# Patient Record
Sex: Female | Born: 1945 | Race: White | Hispanic: No | Smoking: Never smoker
Health system: Southern US, Community
[De-identification: ages and names within clinical notes are randomized; demographics above are authoritative.]

## PROBLEM LIST (undated history)

## (undated) DIAGNOSIS — K219 Gastro-esophageal reflux disease without esophagitis: Secondary | ICD-10-CM

## (undated) DIAGNOSIS — M1990 Unspecified osteoarthritis, unspecified site: Secondary | ICD-10-CM

## (undated) DIAGNOSIS — I10 Essential (primary) hypertension: Secondary | ICD-10-CM

## (undated) HISTORY — DX: Essential (primary) hypertension: I10

## (undated) HISTORY — PX: KNEE SURGERY: SHX244

## (undated) HISTORY — DX: Gastro-esophageal reflux disease without esophagitis: K21.9

## (undated) HISTORY — DX: Unspecified osteoarthritis, unspecified site: M19.90

---

## 2009-06-23 DIAGNOSIS — M1990 Unspecified osteoarthritis, unspecified site: Secondary | ICD-10-CM | POA: Insufficient documentation

## 2009-06-23 DIAGNOSIS — M25569 Pain in unspecified knee: Secondary | ICD-10-CM | POA: Insufficient documentation

## 2009-06-23 DIAGNOSIS — K2100 Gastro-esophageal reflux disease with esophagitis, without bleeding: Secondary | ICD-10-CM | POA: Insufficient documentation

## 2009-06-23 HISTORY — DX: Gastro-esophageal reflux disease with esophagitis, without bleeding: K21.00

## 2009-10-11 ENCOUNTER — Encounter: Payer: Self-pay | Admitting: Cardiovascular Disease

## 2009-10-17 ENCOUNTER — Inpatient Hospital Stay (HOSPITAL_COMMUNITY): Admission: RE | Admit: 2009-10-17 | Discharge: 2009-10-19 | Payer: Self-pay | Admitting: Orthopedic Surgery

## 2010-12-13 DIAGNOSIS — K219 Gastro-esophageal reflux disease without esophagitis: Secondary | ICD-10-CM | POA: Insufficient documentation

## 2011-01-09 ENCOUNTER — Encounter: Payer: Self-pay | Admitting: Cardiovascular Disease

## 2011-01-16 ENCOUNTER — Encounter (HOSPITAL_COMMUNITY)
Admission: RE | Admit: 2011-01-16 | Discharge: 2011-01-16 | Disposition: A | Discharge status: Home / Self Care | Payer: BC Managed Care – PPO | Source: Ambulatory Visit

## 2011-01-16 ENCOUNTER — Other Ambulatory Visit (HOSPITAL_COMMUNITY): Payer: Self-pay | Admitting: Orthopedic Surgery

## 2011-01-16 ENCOUNTER — Ambulatory Visit (HOSPITAL_COMMUNITY)
Admission: RE | Admit: 2011-01-16 | Discharge: 2011-01-16 | Disposition: A | Discharge status: Home / Self Care | Payer: BC Managed Care – PPO | Source: Ambulatory Visit | Attending: Orthopedic Surgery | Admitting: Orthopedic Surgery

## 2011-01-16 DIAGNOSIS — M1711 Unilateral primary osteoarthritis, right knee: Secondary | ICD-10-CM

## 2011-01-16 DIAGNOSIS — Z01810 Encounter for preprocedural cardiovascular examination: Secondary | ICD-10-CM | POA: Insufficient documentation

## 2011-01-16 DIAGNOSIS — M1710 Unilateral primary osteoarthritis, unspecified knee: Secondary | ICD-10-CM | POA: Insufficient documentation

## 2011-01-16 DIAGNOSIS — IMO0002 Reserved for concepts with insufficient information to code with codable children: Secondary | ICD-10-CM | POA: Insufficient documentation

## 2011-01-16 DIAGNOSIS — Z01812 Encounter for preprocedural laboratory examination: Secondary | ICD-10-CM | POA: Insufficient documentation

## 2011-01-16 DIAGNOSIS — Z01818 Encounter for other preprocedural examination: Secondary | ICD-10-CM | POA: Insufficient documentation

## 2011-01-16 LAB — COMPREHENSIVE METABOLIC PANEL
Albumin: 4.2 g/dL (ref 3.5–5.2)
Alkaline Phosphatase: 104 U/L (ref 39–117)
BUN: 19 mg/dL (ref 6–23)
Calcium: 9.8 mg/dL (ref 8.4–10.5)
Creatinine, Ser: 0.89 mg/dL (ref 0.4–1.2)
Glucose, Bld: 109 mg/dL — ABNORMAL HIGH (ref 70–99)
Potassium: 4.2 mEq/L (ref 3.5–5.1)
Total Protein: 6.8 g/dL (ref 6.0–8.3)

## 2011-01-16 LAB — URINALYSIS, ROUTINE W REFLEX MICROSCOPIC
Bilirubin Urine: NEGATIVE
Nitrite: POSITIVE — AB
Specific Gravity, Urine: 1.023 (ref 1.005–1.030)
pH: 6.5 (ref 5.0–8.0)

## 2011-01-16 LAB — CBC
HCT: 42.9 % (ref 36.0–46.0)
Hemoglobin: 14.5 g/dL (ref 12.0–15.0)
MCHC: 33.8 g/dL (ref 30.0–36.0)
MCV: 89.2 fL (ref 78.0–100.0)
RDW: 12.9 % (ref 11.5–15.5)
WBC: 7.5 10*3/uL (ref 4.0–10.5)

## 2011-01-16 LAB — DIFFERENTIAL
Eosinophils Relative: 1 % (ref 0–5)
Monocytes Relative: 9 % (ref 3–12)
Neutrophils Relative %: 50 % (ref 43–77)

## 2011-01-16 LAB — SURGICAL PCR SCREEN
MRSA, PCR: NEGATIVE
Staphylococcus aureus: POSITIVE — AB

## 2011-01-16 LAB — URINE MICROSCOPIC-ADD ON

## 2011-01-16 LAB — APTT: aPTT: 27 seconds (ref 24–37)

## 2011-01-18 LAB — URINE CULTURE
Colony Count: >=100000
Culture  Setup Time: 201202281159

## 2011-01-19 ENCOUNTER — Encounter: Payer: Self-pay | Admitting: Cardiovascular Disease

## 2011-01-19 ENCOUNTER — Encounter (INDEPENDENT_AMBULATORY_CARE_PROVIDER_SITE_OTHER): Payer: BC Managed Care – PPO | Admitting: Cardiovascular Disease

## 2011-01-19 ENCOUNTER — Other Ambulatory Visit: Payer: Self-pay | Admitting: Cardiovascular Disease

## 2011-01-19 DIAGNOSIS — R0989 Other specified symptoms and signs involving the circulatory and respiratory systems: Secondary | ICD-10-CM | POA: Insufficient documentation

## 2011-01-19 DIAGNOSIS — R000 Tachycardia, unspecified: Secondary | ICD-10-CM

## 2011-01-19 DIAGNOSIS — Z01810 Encounter for preprocedural cardiovascular examination: Secondary | ICD-10-CM

## 2011-01-24 ENCOUNTER — Inpatient Hospital Stay (HOSPITAL_COMMUNITY)
Admission: RE | Admit: 2011-01-24 | Discharge: 2011-01-26 | DRG: 470 | Disposition: A | Discharge status: Home Health | Source: Ambulatory Visit | Attending: Orthopedic Surgery | Admitting: Orthopedic Surgery

## 2011-01-24 DIAGNOSIS — M1710 Unilateral primary osteoarthritis, unspecified knee: Principal | ICD-10-CM | POA: Diagnosis present

## 2011-01-24 DIAGNOSIS — D62 Acute posthemorrhagic anemia: Secondary | ICD-10-CM | POA: Diagnosis not present

## 2011-01-24 DIAGNOSIS — Z79899 Other long term (current) drug therapy: Secondary | ICD-10-CM

## 2011-01-24 DIAGNOSIS — Z96659 Presence of unspecified artificial knee joint: Secondary | ICD-10-CM

## 2011-01-24 DIAGNOSIS — K219 Gastro-esophageal reflux disease without esophagitis: Secondary | ICD-10-CM | POA: Diagnosis present

## 2011-01-24 DIAGNOSIS — I10 Essential (primary) hypertension: Secondary | ICD-10-CM | POA: Diagnosis present

## 2011-01-24 DIAGNOSIS — I499 Cardiac arrhythmia, unspecified: Secondary | ICD-10-CM | POA: Diagnosis present

## 2011-01-24 LAB — TYPE AND SCREEN: Antibody Screen: NEGATIVE

## 2011-01-25 LAB — BASIC METABOLIC PANEL
CO2: 25 mEq/L (ref 19–32)
Calcium: 7.9 mg/dL — ABNORMAL LOW (ref 8.4–10.5)
GFR calc Af Amer: >60 mL/min (ref >60)
GFR calc non Af Amer: >60 mL/min (ref >60)
Glucose, Bld: 125 mg/dL — ABNORMAL HIGH (ref 70–99)
Potassium: 4.6 mEq/L (ref 3.5–5.1)
Sodium: 132 mEq/L — ABNORMAL LOW (ref 135–145)

## 2011-01-25 LAB — CBC
HCT: 31.3 % — ABNORMAL LOW (ref 36.0–46.0)
Hemoglobin: 10.2 g/dL — ABNORMAL LOW (ref 12.0–15.0)
MCHC: 32.6 g/dL (ref 30.0–36.0)
RBC: 3.49 MIL/uL — ABNORMAL LOW (ref 3.87–5.11)
WBC: 8.2 10*3/uL (ref 4.0–10.5)

## 2011-01-25 NOTE — Assessment & Plan Note (Signed)
Summary: np6/ EKG infarect/ pt have blue cross/[per Olegario Messier [phone]   Visit Type:  Initial Consult Referring Provider:  Dr. Nilda Simmer Primary Provider:  Dr. Wilburt Finlay  CC:  pt has no complaints today. surgery clearance for right knee.Marland Kitchen  History of Present Illness: 65 yo WF with history of borderline HTN, GERD who is here today for cardiac evaluation before planned right knee replacement. She has had a prior left knee replacement. 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 She was being evaluated for her surgery and her EKG was felt to be slightly different than in the past. She was tachycardic. She has had no chest pain, SOB, palpitations, near syncope, LE edema, orthopnea, PND.   Current Medications (verified): 1)  Ranitidine Hcl 150 Mg Caps (Ranitidine Hcl) .Marland Kitchen.. 1-2 Tablets Once Daily. 2)  Amoxicillin 875 Mg Tabs (Amoxicillin) .Marland Kitchen.. 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 Never smoked Alcohol Use - no Regular Exercise - no Drug Use - no  Review of Systems       The patient complains of joint pain.  The patient denies fatigue, malaise, fever, weight gain/loss, vision loss, decreased hearing, hoarseness, chest pain, palpitations, shortness of breath, prolonged cough, wheezing, sleep apnea, coughing up blood, abdominal pain, blood in stool, nausea, vomiting, diarrhea, heartburn, incontinence, blood in urine, muscle weakness, leg swelling, rash, skin lesions, headache, fainting, dizziness, depression, anxiety, enlarged lymph nodes, easy  bruising or bleeding, and environmental allergies.    Vital Signs:  Patient profile:   65 year old female Height:      62.25 inches Weight:      163.50 pounds BMI:     29.77 Pulse rate:   110 / minute Resp:     16 per minute BP sitting:   165 / 88  (left arm)  Vitals Entered By: Celestia Khat, CMA (January 19, 2011 10:38 AM)  Physical Exam  General:  General: Well developed, well nourished, NAD HEENT: OP clear, mucus membranes moist SKIN: warm, dry Neuro: No focal deficits Musculoskeletal: Muscle strength 5/5 all ext Psychiatric: Mood and affect normal Neck: No JVD, no carotid bruits, no thyromegaly, no lymphadenopathy. Lungs:Clear bilaterally, no wheezes, rhonci, crackles CV: RRR no murmurs, gallops rubs Abdomen: soft, NT, ND, BS present Extremities: No edema, pulses 2+.    Impression & Recommendations:  Problem # 1:  TACHYCARDIA (ICD-785) Sinus tachycardia. She is very anxious. I do not see any changes on her EKG over the last year other than the tachycardia. She has no chest pain, dyspnea, near syncope or syncope. She is very anxious which probably does contribute to her tachycardia. She also has a UTI. Will check TSH today.   Problem # 2:  PRE-OPERATIVE CARDIAC EXAM (ICD-V72.81) Based on current ACC/AHA guidelines, no ischemic testing is needed before surgery. She can proceed to her knee replacement  without further cardiac workup.   Orders: EKG w/ Interpretation (93000) TLB-TSH (Thyroid Stimulating Hormone) (16109-UEA)  Patient Instructions: 1)  Your physician recommends that you schedule a follow-up appointment as  needed. 2)  Your physician recommends that you continue on your current medications as directed. Please refer to the Current Medication list given to you today.

## 2011-01-26 LAB — CBC
HCT: 30.1 % — ABNORMAL LOW (ref 36.0–46.0)
Hemoglobin: 9.7 g/dL — ABNORMAL LOW (ref 12.0–15.0)
MCH: 28.9 pg (ref 26.0–34.0)
MCHC: 32.2 g/dL (ref 30.0–36.0)
RBC: 3.36 MIL/uL — ABNORMAL LOW (ref 3.87–5.11)

## 2011-01-26 LAB — BASIC METABOLIC PANEL
CO2: 25 mEq/L (ref 19–32)
Chloride: 104 mEq/L (ref 96–112)
GFR calc Af Amer: >60 mL/min (ref >60)
Glucose, Bld: 120 mg/dL — ABNORMAL HIGH (ref 70–99)
Sodium: 134 mEq/L — ABNORMAL LOW (ref 135–145)

## 2011-02-12 NOTE — Op Note (Signed)
Kayla Clark, Kayla Clark              ACCOUNT NO.:  [id number]  MEDICAL RECORD NO.:  [id number]           PATIENT TYPE:  I  LOCATION:  5005                         FACILITY:  [facility]  PHYSICIAN:  Taysean Wager A. Thurston Hole, M.D. DATE OF BIRTH:  Mar 02, 1946  DATE OF PROCEDURE:  01/24/2011 DATE OF DISCHARGE:                              OPERATIVE REPORT   PREOPERATIVE DIAGNOSIS:  Right knee degenerative joint disease.  POSTOPERATIVE DIAGNOSIS:  Right knee degenerative joint disease.  PROCEDURES: 1. Right total knee replacement using DePuy cemented total knee system     with #2.5 cemented femur, #3 cemented tibia with 12.5 mm     polyethylene RP tibial spacer, and 35-mm polyethylene cemented     patella. 2. Zinacef impregnated cement.  SURGEON:  Elana Alm. Thurston Hole, MD  ASSISTANT:  Julien Girt, PA-C  ANESTHESIA:  General.  OPERATIVE TIME:  1 hour and 30 minutes.  COMPLICATIONS:  None.  DESCRIPTION OF PROCEDURE:  Kayla Clark was brought to the operating room on January 24, 2011 after a femoral nerve block was placed in holding by Anesthesia.  She was placed on operative table in supine position. After being placed under general anesthesia, she had a Foley catheter placed under sterile conditions.  Her right knee was examined.  Range of motion -5 to 125 degrees, moderate varus deformity, knee stable ligamentous exam with normal patellar tracking.  She received Ancef 2 grams IV preoperatively for prophylaxis.  Her right leg was prepped using sterile DuraPrep and draped using sterile technique.  Time-out procedure was called and correct right knee identified.  The right leg was exsanguinated and the thigh tourniquet elevated to 365 mmHg. Initially through a 15-cm longitudinal incision based over the patella, initial exposure was made.  The underlying subcutaneous tissues were incised along the skin incision.  A median arthrotomy was performed revealing an excessive amount of  normal-appearing joint fluid.  The articular surfaces were inspected.  She had grade 4 changes medially, grade 3-4 changes laterally, and grade 3-4 changes in the patellofemoral joint.  Osteophytes were removed from the femoral condyles and tibial plateau.  The medial and lateral meniscal remnants were removed as well as the anterior cruciate ligament.  An intramedullary drill was then drilled up the femoral canal for placement of the distal femoral cutting jig which was placed in the appropriate manner rotation and the distal 10-mm cut was made.  The distal femur was then sized.  The #2.5 was found to be the appropriate size and the #2.5 cutting jig was placed in the appropriate manner of external rotation and then these cuts were made.  The proximal tibia was then exposed.  Tibial spines were removed with an oscillating saw.  Intramedullary drill was drilled down the tibial canal for placement of the proximal tibial cutting jig which was placed in the appropriate manner of rotation and the proximal 6-mm cut was made based off the medial or lower side.  Spacer blocks were then placed in flexion and extension.  The 12.5 mm blocks gave excellent balancing, excellent stability, and excellent correction of her flexion and varus deformities.  At this point, the #  3 tibial baseplate trial was placed on the cut tibial surface with an excellent fit and the keel cut was made.  The PCL box cutter was then placed on the distal femur and these cuts were made.  At this point, with the #2.5 femoral trial in place and the #3 tibial baseplate trial in place, a 12.5 mm polyethylene RP tibial spacer was placed on the tibial baseplate.  The knee reduced, taken through a range of motion from 0-125 degrees and had excellent correction of flexion and varus deformities.  Normal patellar tracking noted as well.  A resurfacing 8.5 mm cut was made on patella and 3 locking holes were placed for a 35-mm polyethylene  patellar trial. Again, patellofemoral tracking was evaluated and found to be normal.  At this point, it was felt that all the trial components were of excellent size and stability.  They were then removed.  The knee was then jet lavage irrigated with 3 L of saline.  The proximal tibia was then exposed and #3 tibial baseplate was then assessed with impregnated cement packing was hammered into position with an excellent fit with excess cement being removed around the edges.  The #2.5 femoral component with cement backing was hammered into position also with an excellent fit with excess cement being removed around the edges.  The 12.5 mm polyethylene RP tibial spacer was placed on tibial baseplate. The knee reduced, taken through range of motion, 0-125 degrees with excellent stability and excellent correction of the flexion and varus deformities.  The 35-mm polyethylene cement backed patella was then placed into its position and held there with a clamp.  After the cement hardened, again patellofemoral tracking was evaluated and found to be normal.  At this point, it was felt that all components were of excellent size and stability.  The wound was further irrigated with saline and then the arthrotomy was closed with #1 Ethilon suture over 2 medium Hemovac drains.  Subcutaneous tissues were closed with 0 and 2-0 Vicryl and subcuticular layer closed with 4-0 Monocryl.  Sterile dressings and long-leg splint applied.  The patient awakened, extubated, and taken to recovery room in stable condition.  Needle and sponge counts were correct x2 at the end of the case.  Neurovascular status was normal.  Pulses 2+ and symmetric.     Menno Vanbergen A. Thurston Hole, M.D.     RAW/MEDQ  D:  01/24/2011  T:  01/25/2011  Job:  [id number]  Electronically Signed by Salvatore Marvel M.D. on 02/12/2011 01:40:17 PM

## 2011-02-13 ENCOUNTER — Ambulatory Visit: Attending: Orthopedic Surgery | Admitting: Physical Therapy

## 2011-02-13 DIAGNOSIS — M1710 Unilateral primary osteoarthritis, unspecified knee: Secondary | ICD-10-CM | POA: Insufficient documentation

## 2011-02-13 DIAGNOSIS — M25569 Pain in unspecified knee: Secondary | ICD-10-CM | POA: Insufficient documentation

## 2011-02-13 DIAGNOSIS — Z96659 Presence of unspecified artificial knee joint: Secondary | ICD-10-CM | POA: Insufficient documentation

## 2011-02-13 DIAGNOSIS — IMO0001 Reserved for inherently not codable concepts without codable children: Secondary | ICD-10-CM | POA: Insufficient documentation

## 2011-02-13 DIAGNOSIS — M6281 Muscle weakness (generalized): Secondary | ICD-10-CM | POA: Insufficient documentation

## 2011-02-13 DIAGNOSIS — R269 Unspecified abnormalities of gait and mobility: Secondary | ICD-10-CM | POA: Insufficient documentation

## 2011-02-13 DIAGNOSIS — M25669 Stiffness of unspecified knee, not elsewhere classified: Secondary | ICD-10-CM | POA: Insufficient documentation

## 2011-02-15 NOTE — Letter (Signed)
Summary: [facility]  [facility]   Imported By: Marylou Mccoy 02/05/2011 11:32:27  _____________________________________________________________________  External Attachment:    Type:   Image     Comment:   External Document

## 2011-02-16 ENCOUNTER — Ambulatory Visit: Attending: Orthopedic Surgery | Admitting: Physical Therapy

## 2011-02-16 DIAGNOSIS — M1710 Unilateral primary osteoarthritis, unspecified knee: Secondary | ICD-10-CM | POA: Insufficient documentation

## 2011-02-16 DIAGNOSIS — M25669 Stiffness of unspecified knee, not elsewhere classified: Secondary | ICD-10-CM | POA: Insufficient documentation

## 2011-02-16 DIAGNOSIS — R269 Unspecified abnormalities of gait and mobility: Secondary | ICD-10-CM | POA: Insufficient documentation

## 2011-02-16 DIAGNOSIS — M25569 Pain in unspecified knee: Secondary | ICD-10-CM | POA: Insufficient documentation

## 2011-02-16 DIAGNOSIS — IMO0001 Reserved for inherently not codable concepts without codable children: Secondary | ICD-10-CM | POA: Insufficient documentation

## 2011-02-16 DIAGNOSIS — M6281 Muscle weakness (generalized): Secondary | ICD-10-CM | POA: Insufficient documentation

## 2011-02-19 ENCOUNTER — Ambulatory Visit: Attending: Orthopedic Surgery | Admitting: Physical Therapy

## 2011-02-19 DIAGNOSIS — M25569 Pain in unspecified knee: Secondary | ICD-10-CM | POA: Insufficient documentation

## 2011-02-19 DIAGNOSIS — M6281 Muscle weakness (generalized): Secondary | ICD-10-CM | POA: Insufficient documentation

## 2011-02-19 DIAGNOSIS — Z96659 Presence of unspecified artificial knee joint: Secondary | ICD-10-CM | POA: Insufficient documentation

## 2011-02-19 DIAGNOSIS — M1710 Unilateral primary osteoarthritis, unspecified knee: Secondary | ICD-10-CM | POA: Insufficient documentation

## 2011-02-19 DIAGNOSIS — IMO0001 Reserved for inherently not codable concepts without codable children: Secondary | ICD-10-CM | POA: Insufficient documentation

## 2011-02-19 DIAGNOSIS — M25669 Stiffness of unspecified knee, not elsewhere classified: Secondary | ICD-10-CM | POA: Insufficient documentation

## 2011-02-19 DIAGNOSIS — R269 Unspecified abnormalities of gait and mobility: Secondary | ICD-10-CM | POA: Insufficient documentation

## 2011-02-20 LAB — CBC
HCT: 27.7 % — ABNORMAL LOW (ref 36.0–46.0)
Hemoglobin: 9.6 g/dL — ABNORMAL LOW (ref 12.0–15.0)
MCHC: 34.6 g/dL (ref 30.0–36.0)
MCV: 91.1 fL (ref 78.0–100.0)
Platelets: 139 10*3/uL — ABNORMAL LOW (ref 150–400)
RDW: 12.6 % (ref 11.5–15.5)

## 2011-02-20 LAB — BASIC METABOLIC PANEL
BUN: 9 mg/dL (ref 6–23)
CO2: 26 mEq/L (ref 19–32)
Glucose, Bld: 120 mg/dL — ABNORMAL HIGH (ref 70–99)
Potassium: 3.9 mEq/L (ref 3.5–5.1)
Sodium: 135 mEq/L (ref 135–145)

## 2011-02-21 ENCOUNTER — Ambulatory Visit: Admitting: Physical Therapy

## 2011-02-21 LAB — COMPREHENSIVE METABOLIC PANEL
Albumin: 4.6 g/dL (ref 3.5–5.2)
BUN: 33 mg/dL — ABNORMAL HIGH (ref 6–23)
Calcium: 9.9 mg/dL (ref 8.4–10.5)
Chloride: 107 mEq/L (ref 96–112)
Creatinine, Ser: 0.99 mg/dL (ref 0.4–1.2)
Total Bilirubin: 0.6 mg/dL (ref 0.3–1.2)
Total Protein: 8 g/dL (ref 6.0–8.3)

## 2011-02-21 LAB — BASIC METABOLIC PANEL
BUN: 12 mg/dL (ref 6–23)
CO2: 26 mEq/L (ref 19–32)
Calcium: 8.3 mg/dL — ABNORMAL LOW (ref 8.4–10.5)
GFR calc non Af Amer: >60 mL/min (ref >60)
Glucose, Bld: 125 mg/dL — ABNORMAL HIGH (ref 70–99)

## 2011-02-21 LAB — BLOOD GAS, ARTERIAL
Acid-base deficit: 2.1 mmol/L — ABNORMAL HIGH (ref 0.0–2.0)
Drawn by: 181601
O2 Saturation: 98.6 %
TCO2: 22.7 mmol/L (ref 0–100)

## 2011-02-21 LAB — URINALYSIS, ROUTINE W REFLEX MICROSCOPIC
Bilirubin Urine: NEGATIVE
Hgb urine dipstick: NEGATIVE
Ketones, ur: NEGATIVE mg/dL
Protein, ur: NEGATIVE mg/dL
Urobilinogen, UA: 0.2 mg/dL (ref 0.0–1.0)

## 2011-02-21 LAB — CBC
HCT: 28.8 % — ABNORMAL LOW (ref 36.0–46.0)
HCT: 42.8 % (ref 36.0–46.0)
MCHC: 35.1 g/dL (ref 30.0–36.0)
MCHC: 35.1 g/dL (ref 30.0–36.0)
MCV: 89.8 fL (ref 78.0–100.0)
Platelets: 149 10*3/uL — ABNORMAL LOW (ref 150–400)
Platelets: 248 10*3/uL (ref 150–400)
RDW: 13 % (ref 11.5–15.5)
RDW: 13.1 % (ref 11.5–15.5)

## 2011-02-21 LAB — DIFFERENTIAL
Basophils Absolute: 0.1 10*3/uL (ref 0.0–0.1)
Lymphocytes Relative: 33 % (ref 12–46)
Lymphs Abs: 2.4 10*3/uL (ref 0.7–4.0)
Monocytes Absolute: 0.6 10*3/uL (ref 0.1–1.0)
Neutro Abs: 4 10*3/uL (ref 1.7–7.7)

## 2011-02-21 LAB — TYPE AND SCREEN
ABO/RH(D): O POS
Antibody Screen: NEGATIVE

## 2011-02-21 LAB — URINE CULTURE

## 2011-02-21 LAB — ABO/RH: ABO/RH(D): O POS

## 2011-02-26 ENCOUNTER — Ambulatory Visit: Admitting: Physical Therapy

## 2011-02-28 ENCOUNTER — Ambulatory Visit: Admitting: Physical Therapy

## 2011-02-28 DIAGNOSIS — M1710 Unilateral primary osteoarthritis, unspecified knee: Secondary | ICD-10-CM | POA: Insufficient documentation

## 2011-02-28 DIAGNOSIS — M25669 Stiffness of unspecified knee, not elsewhere classified: Secondary | ICD-10-CM | POA: Insufficient documentation

## 2011-02-28 DIAGNOSIS — M25569 Pain in unspecified knee: Secondary | ICD-10-CM | POA: Insufficient documentation

## 2011-02-28 DIAGNOSIS — M6281 Muscle weakness (generalized): Secondary | ICD-10-CM | POA: Insufficient documentation

## 2011-02-28 DIAGNOSIS — IMO0001 Reserved for inherently not codable concepts without codable children: Secondary | ICD-10-CM | POA: Insufficient documentation

## 2011-02-28 DIAGNOSIS — R269 Unspecified abnormalities of gait and mobility: Secondary | ICD-10-CM | POA: Insufficient documentation

## 2011-03-02 ENCOUNTER — Ambulatory Visit: Admitting: Physical Therapy

## 2011-03-05 ENCOUNTER — Ambulatory Visit: Admitting: Physical Therapy

## 2011-03-07 ENCOUNTER — Ambulatory Visit: Admitting: Physical Therapy

## 2011-03-09 ENCOUNTER — Ambulatory Visit: Admitting: Physical Therapy

## 2011-03-12 ENCOUNTER — Ambulatory Visit: Admitting: Physical Therapy

## 2011-03-14 ENCOUNTER — Ambulatory Visit: Admitting: Physical Therapy

## 2011-03-16 ENCOUNTER — Ambulatory Visit: Admitting: Physical Therapy

## 2011-03-19 ENCOUNTER — Ambulatory Visit: Admitting: Physical Therapy

## 2011-03-21 ENCOUNTER — Ambulatory Visit: Attending: Orthopedic Surgery | Admitting: Physical Therapy

## 2011-03-21 DIAGNOSIS — R269 Unspecified abnormalities of gait and mobility: Secondary | ICD-10-CM | POA: Insufficient documentation

## 2011-03-21 DIAGNOSIS — M6281 Muscle weakness (generalized): Secondary | ICD-10-CM | POA: Insufficient documentation

## 2011-03-21 DIAGNOSIS — M25669 Stiffness of unspecified knee, not elsewhere classified: Secondary | ICD-10-CM | POA: Insufficient documentation

## 2011-03-21 DIAGNOSIS — Z96659 Presence of unspecified artificial knee joint: Secondary | ICD-10-CM | POA: Insufficient documentation

## 2011-03-21 DIAGNOSIS — M1710 Unilateral primary osteoarthritis, unspecified knee: Secondary | ICD-10-CM | POA: Insufficient documentation

## 2011-03-21 DIAGNOSIS — IMO0001 Reserved for inherently not codable concepts without codable children: Secondary | ICD-10-CM | POA: Insufficient documentation

## 2011-03-21 DIAGNOSIS — M25569 Pain in unspecified knee: Secondary | ICD-10-CM | POA: Insufficient documentation

## 2011-03-23 ENCOUNTER — Ambulatory Visit: Admitting: Physical Therapy

## 2011-03-26 ENCOUNTER — Ambulatory Visit: Admitting: Physical Therapy

## 2011-03-28 ENCOUNTER — Ambulatory Visit: Admitting: Physical Therapy

## 2011-03-30 ENCOUNTER — Ambulatory Visit: Admitting: Physical Therapy

## 2011-04-02 ENCOUNTER — Ambulatory Visit: Admitting: Physical Therapy

## 2011-04-04 ENCOUNTER — Ambulatory Visit: Admitting: Physical Therapy

## 2011-04-06 ENCOUNTER — Ambulatory Visit: Admitting: Physical Therapy

## 2011-04-09 ENCOUNTER — Ambulatory Visit: Admitting: Physical Therapy

## 2011-04-11 ENCOUNTER — Ambulatory Visit: Admitting: Physical Therapy

## 2011-04-13 ENCOUNTER — Ambulatory Visit: Admitting: Physical Therapy

## 2011-04-17 ENCOUNTER — Ambulatory Visit: Admitting: Physical Therapy

## 2011-04-19 ENCOUNTER — Ambulatory Visit: Admitting: Physical Therapy

## 2011-07-01 NOTE — Op Note (Signed)
  [name], [name]              ACCOUNT NO.:  [id number]  MEDICAL RECORD NO.:  [id number]  LOCATION:                                 FACILITY:  PHYSICIAN:  Elana Alm. Thurston Hole, M.D. DATE OF BIRTH:  1946-06-02  DATE OF PROCEDURE:  01/24/2011 DATE OF DISCHARGE:                              OPERATIVE REPORT   ADDENDUM: This is an addendum to the coding of the primary osteoarthritis for Ms. Kayla Clark whose surgery was on January 24, 2011, for a right total knee replacement.  The correct diagnosis is primary localized osteoarthritis of the knee code #715.16 instead of localized osteoarthritis nonspecified code number 715.36.  The correct code is 715.16, which is primary localized osteoarthritis.     Zyen Triggs A. Thurston Hole, M.D.     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 on 07/01/2011 02:52:58 PM

## 2013-03-19 LAB — HM PAP SMEAR: HM Pap smear: NORMAL

## 2015-04-07 ENCOUNTER — Ambulatory Visit (HOSPITAL_BASED_OUTPATIENT_CLINIC_OR_DEPARTMENT_OTHER)
Admission: RE | Admit: 2015-04-07 | Discharge: 2015-04-07 | Disposition: A | Discharge status: Home / Self Care | Payer: Federal, State, Local not specified - PPO | Source: Ambulatory Visit | Attending: Medical | Admitting: Medical

## 2015-04-07 ENCOUNTER — Encounter: Payer: Self-pay | Admitting: Medical

## 2015-04-07 ENCOUNTER — Ambulatory Visit (INDEPENDENT_AMBULATORY_CARE_PROVIDER_SITE_OTHER): Payer: Federal, State, Local not specified - PPO | Admitting: Medical

## 2015-04-07 VITALS — BP 165/100 | HR 81 | Temp 98.3°F | Ht 62.5 in | Wt 161.6 lb

## 2015-04-07 DIAGNOSIS — I10 Essential (primary) hypertension: Secondary | ICD-10-CM | POA: Diagnosis not present

## 2015-04-07 DIAGNOSIS — M25551 Pain in right hip: Secondary | ICD-10-CM | POA: Insufficient documentation

## 2015-04-07 DIAGNOSIS — R000 Tachycardia, unspecified: Secondary | ICD-10-CM

## 2015-04-07 DIAGNOSIS — W19XXXA Unspecified fall, initial encounter: Secondary | ICD-10-CM | POA: Diagnosis not present

## 2015-04-07 DIAGNOSIS — M25559 Pain in unspecified hip: Secondary | ICD-10-CM | POA: Insufficient documentation

## 2015-04-07 DIAGNOSIS — S32301A Unspecified fracture of right ilium, initial encounter for closed fracture: Secondary | ICD-10-CM | POA: Insufficient documentation

## 2015-04-07 DIAGNOSIS — M8580 Other specified disorders of bone density and structure, unspecified site: Secondary | ICD-10-CM | POA: Insufficient documentation

## 2015-04-07 MED ORDER — LISINOPRIL 10 MG PO TABS: 10.0000 mg | ORAL_TABLET | Freq: Every day | ORAL | Status: DC

## 2015-04-07 MED ORDER — TRAMADOL HCL 50 MG PO TABS: 50.0000 mg | ORAL_TABLET | Freq: Three times a day (TID) | ORAL | Status: DC | PRN

## 2015-04-07 NOTE — Assessment & Plan Note (Addendum)
Will rx lisinopril today. Pt bp is elevated today with hx of elevation in past. Some pain, nsaid use and stress may be contributing.  Will get labs today. Cbc and cmp.

## 2015-04-07 NOTE — Progress Notes (Signed)
Pre visit review using our clinic review tool, if applicable. No additional management support is needed unless otherwise documented below in the visit note. 

## 2015-04-07 NOTE — Progress Notes (Signed)
Subjective:    Patient ID: Kayla FlockLinda B Clark, female    DOB: 09/19/1946, 69 y.o.   MRN: [id number]  HPI  I have reviewed pt PMH, PSH, FH, Social History and Surgical History  Pt states borderline bp in the past. Never was on meds in past. At former pcp office bp would be up and down. Often borderline per pt.  Also states hx of mild elevated pulse. But never evaluated in ED for or given meds. Pulse today not elevated.  5 weeks ago was on back porch. Neighbor dog was trying to attack her dog. Pt own dog wrapped her leash around her legs. She fell over and hit her rt hip area. Pt evaluated pelvis area and she was told no fx.([facility] Since then she has moderate pain rt groin and pain upper buttox area. Pain daily for 5 wks.   Dad deceased at 68 yo.(MI) Mom deceased at 68 yo.       Review of Systems  Constitutional: Negative for fever, chills, diaphoresis, activity change and fatigue.  Respiratory: Negative for cough, chest tightness and shortness of breath.   Cardiovascular: Negative for chest pain, palpitations and leg swelling.  Gastrointestinal: Negative for nausea, vomiting and abdominal pain.  Musculoskeletal: Negative for neck pain and neck stiffness.       Rt groin and some pain rt upper buttox. Iliac spine region.  Neurological: Negative for dizziness, tremors, seizures, syncope, facial asymmetry, speech difficulty, weakness, light-headedness, numbness and headaches.  Psychiatric/Behavioral: Negative for behavioral problems, confusion and agitation. The patient is not nervous/anxious.      Past Medical History  Diagnosis Date  . Hypertension     hx of borderline bp in past. Never was on meds.    History   Social History  . Marital Status: Married    Spouse Name: N/A  . Number of Children: N/A  . Years of Education: N/A   Occupational History  . Not on file.   Social History Main Topics  . Smoking status: Never Smoker   . Smokeless tobacco: Never Used    . Alcohol Use: No  . Drug Use: No  . Sexual Activity: Not on file   Other Topics Concern  . Not on file   Social History Narrative  . No narrative on file    Past Surgical History  Procedure Laterality Date  . Knee surgery      2 knee transplants    Family History  Problem Relation Age of Onset  . Heart disease Mother   . Heart disease Father     No Known Allergies  No current outpatient prescriptions on file prior to visit.   No current facility-administered medications on file prior to visit.    BP 176/101 mmHg  Pulse 81  Temp(Src) 98.3 F (36.8 C) (Oral)  Ht 5' 2.5" (1.588 m)  Wt 161 lb 9.6 oz (73.301 kg)  BMI 29.07 kg/m2        Objective:   Physical Exam  General Mental Status- Alert. General Appearance- Not in acute distress.   Skin General: Color- Normal Color. Moisture- Normal Moisture.  Neck Carotid Arteries- Normal color. Moisture- Normal Moisture. No carotid bruits. No JVD.  Chest and Lung Exam Auscultation: Breath Sounds:-Normal.  Cardiovascular Auscultation:Rythm- Regular. Murmurs & Other Heart Sounds:Auscultation of the heart reveals- No Murmurs.  Abdomen Inspection:-Inspeection Normal. Palpation/Percussion:Note:No mass. Palpation and Percussion of the abdomen reveal- Non Tender, Non Distended + BS, no rebound or guarding.    Neurologic Cranial  Nerve exam:- CN III-XII intact(No nystagmus), symmetric smile. Drift Test:- No drift. Romberg Exam:- Negative.  Heal to Toe Gait exam:-Normal. Finger to Nose:- Normal/Intact Strength:- 5/5 equal and symmetric strength both upper and lower extremities.  Rt hip- on rotation of hip. Rt groin region pain. No crepitus.  Pelvis- on palpation rt side  iliac spine region has  mild tender upper buttock region.      Assessment & Plan:

## 2015-04-07 NOTE — Patient Instructions (Signed)
HTN (hypertension) Will rx lisinopril today. Pt bp is elevated today with hx of elevation in past. Some pain, nsaid use and stress may be contributing.  Will get labs today. Cbc and cmp.   Pain in joint, pelvic region and thigh Rt hip/froin area and iliac crest region pain. Will get xray(since pain persist post 5 wks injury). If xray negative and pain persists then refer to PT.   Continue ibuprofen but use tramadol if needed.    With bp elevation if any neuro or cardio signs or symptoms then ED eval.  Follow up in 10 days- 2 wks or as needed.

## 2015-04-07 NOTE — Assessment & Plan Note (Signed)
Rt hip/froin area and iliac crest region pain. Will get xray(since pain persist post 5 wks injury). If xray negative and pain persists then refer to PT.   Continue ibuprofen but use tramadol if needed.

## 2015-04-08 ENCOUNTER — Telehealth: Payer: Self-pay | Admitting: Medical

## 2015-04-08 LAB — CBC WITH DIFFERENTIAL/PLATELET
BASOS ABS: 0.1 10*3/uL (ref 0.0–0.1)
Basophils Relative: 0.8 % (ref 0.0–3.0)
EOS ABS: 0.1 10*3/uL (ref 0.0–0.7)
Eosinophils Relative: 0.9 % (ref 0.0–5.0)
HCT: 34.3 % — ABNORMAL LOW (ref 36.0–46.0)
Hemoglobin: 11.6 g/dL — ABNORMAL LOW (ref 12.0–15.0)
LYMPHS PCT: 17.4 % (ref 12.0–46.0)
Lymphs Abs: 1.8 10*3/uL (ref 0.7–4.0)
MCHC: 33.7 g/dL (ref 30.0–36.0)
MCV: 88.1 fl (ref 78.0–100.0)
MONO ABS: 0.6 10*3/uL (ref 0.1–1.0)
Monocytes Relative: 6.3 % (ref 3.0–12.0)
NEUTROS ABS: 7.7 10*3/uL (ref 1.4–7.7)
NEUTROS PCT: 74.6 % (ref 43.0–77.0)
Platelets: 344 10*3/uL (ref 150.0–400.0)
RBC: 3.89 Mil/uL (ref 3.87–5.11)
RDW: 13.6 % (ref 11.5–15.5)
WBC: 10.3 10*3/uL (ref 4.0–10.5)

## 2015-04-08 LAB — COMPREHENSIVE METABOLIC PANEL
ALBUMIN: 3.8 g/dL (ref 3.5–5.2)
ALK PHOS: 152 U/L — AB (ref 39–117)
ALT: 10 U/L (ref 0–35)
AST: 11 U/L (ref 0–37)
BUN: 19 mg/dL (ref 6–23)
CO2: 25 mEq/L (ref 19–32)
Calcium: 9.1 mg/dL (ref 8.4–10.5)
Chloride: 106 mEq/L (ref 96–112)
Creatinine, Ser: 0.85 mg/dL (ref 0.40–1.20)
GFR: 70.77 mL/min (ref >60.00)
Glucose, Bld: 90 mg/dL (ref 70–99)
POTASSIUM: 3 meq/L — AB (ref 3.5–5.1)
SODIUM: 143 meq/L (ref 135–145)
TOTAL PROTEIN: 7.2 g/dL (ref 6.0–8.3)
Total Bilirubin: 0.4 mg/dL (ref 0.2–1.2)

## 2015-04-08 NOTE — Telephone Encounter (Signed)
Pt injury 5 wks ago. xrays showed fracture in pelvis. I notified her. Tramadol helped a lot with pain. Will follow pt cbc drawn yesterday. Follow up in 2 wks. At that point will be 7 wks post injury. If pain persisting will refer to orthopedist.

## 2015-04-21 ENCOUNTER — Encounter: Payer: Self-pay | Admitting: Medical

## 2015-04-21 ENCOUNTER — Ambulatory Visit (INDEPENDENT_AMBULATORY_CARE_PROVIDER_SITE_OTHER): Payer: Federal, State, Local not specified - PPO | Admitting: Medical

## 2015-04-21 VITALS — BP 170/82 | HR 87 | Temp 98.1°F | Ht 62.5 in | Wt 163.4 lb

## 2015-04-21 DIAGNOSIS — I10 Essential (primary) hypertension: Secondary | ICD-10-CM

## 2015-04-21 DIAGNOSIS — E876 Hypokalemia: Secondary | ICD-10-CM | POA: Diagnosis not present

## 2015-04-21 DIAGNOSIS — R000 Tachycardia, unspecified: Secondary | ICD-10-CM

## 2015-04-21 DIAGNOSIS — M25559 Pain in unspecified hip: Secondary | ICD-10-CM | POA: Diagnosis not present

## 2015-04-21 NOTE — Progress Notes (Signed)
Pre visit review using our clinic review tool, if applicable. No additional management support is needed unless otherwise documented below in the visit note. 

## 2015-04-21 NOTE — Addendum Note (Signed)
Addended by: Gwenevere AbbotSAGUIER, Eudora Guevarra M on: 04/21/2015 06:38 PM   Modules accepted: Orders

## 2015-04-21 NOTE — Assessment & Plan Note (Addendum)
Contiue to eat bananas. Repeat cmp hopefully this coming week. Lab closed now. Pt will try. Order placed.

## 2015-04-21 NOTE — Patient Instructions (Addendum)
HTN (hypertension) You were stressed getting here but bp worse systolic and better diastolic than last time. I want you to check bp at home with your cuff. If bp over 140/90 then I want you to take 2 of your lisinopril every day. Give me call by early next week and update me. I can call you in higher dose if you are using the 20 mg dose.   Tachycardia None today.   Pain in joint, pelvic region and thigh Pt tolerating this well. Had fracture that was 536 wks old when I saw her and dx. She is using tramadol sparingly.   Hypokalemia Contiue to eat bananas. Repeat cmp hopefully this coming week. Lab closed now. Pt will try. Order placed.    Follow up date  yet to be determined. If bp contoled by early next week then could make 3 month appointent. If not controlled will need to see within 2 wks.

## 2015-04-21 NOTE — Progress Notes (Signed)
Subjective:    Patient ID: Kayla FlockLinda B Clark, female    DOB: 11/17/1946, 69 y.o.   MRN: [id number]  HPI  Pt in for bp check. She was in [place] and stressed on initial read. Last visit stared on lisinopril No cardiac or neurologic signs or symptoms. On bp recheck bp were still high.   Pt has pelvic rx that at time I saw her was about 6 wks post trauma. Her pain is gradually getting better. She is taking tramadol 1.5 tab a day.  Pt had low k on last visit. Pt is eating 1-2 banana a day.     Review of Systems  Constitutional: Negative for fever, chills, diaphoresis, activity change and fatigue.  Respiratory: Negative for cough, chest tightness and shortness of breath.   Cardiovascular: Negative for chest pain, palpitations and leg swelling.  Gastrointestinal: Negative for nausea, vomiting and abdominal pain.  Musculoskeletal: Negative for neck pain and neck stiffness.  Neurological: Negative for dizziness, tremors, seizures, syncope, facial asymmetry, speech difficulty, weakness, light-headedness, numbness and headaches.  Psychiatric/Behavioral: Negative for behavioral problems, confusion and agitation. The patient is not nervous/anxious.    Past Medical History  Diagnosis Date  . Hypertension     hx of borderline bp in past. Never was on meds.    History   Social History  . Marital Status: Married    Spouse Name: N/A  . Number of Children: N/A  . Years of Education: N/A   Occupational History  . Not on file.   Social History Main Topics  . Smoking status: Never Smoker   . Smokeless tobacco: Never Used  . Alcohol Use: No  . Drug Use: No  . Sexual Activity: Not on file   Other Topics Concern  . Not on file   Social History Narrative    Past Surgical History  Procedure Laterality Date  . Knee surgery      2 knee transplants    Family History  Problem Relation Age of Onset  . Heart disease Mother   . Heart disease Father     No Known  Allergies  Current Outpatient Prescriptions on File Prior to Visit  Medication Sig Dispense Refill  . lisinopril (PRINIVIL,ZESTRIL) 10 MG tablet Take 1 tablet (10 mg total) by mouth daily. 30 tablet 0  . traMADol (ULTRAM) 50 MG tablet Take 1 tablet (50 mg total) by mouth every 8 (eight) hours as needed. 30 tablet 0   No current facility-administered medications on file prior to visit.    BP 170/82 mmHg  Pulse 87  Temp(Src) 98.1 F (36.7 C) (Oral)  Ht 5' 2.5" (1.588 m)  Wt 163 lb 6.4 oz (74.118 kg)  BMI 29.39 kg/m2  SpO2 98%       Objective:   Physical Exam  General Mental Status- Alert. General Appearance- Not in acute distress.   Skin General: Color- Normal Color. Moisture- Normal Moisture.  Neck Carotid Arteries- Normal color. Moisture- Normal Moisture. No carotid bruits. No JVD.  Chest and Lung Exam Auscultation: Breath Sounds:-Normal.  Cardiovascular Auscultation:Rythm- Regular. Murmurs & Other Heart Sounds:Auscultation of the heart reveals- No Murmurs.  Abdomen Inspection:-Inspeection Normal. Palpation/Percussion:Note:No mass. Palpation and Percussion of the abdomen reveal- Non Tender, Non Distended + BS, no rebound or guarding.    Neurologic Cranial Nerve exam:- CN III-XII intact(No nystagmus), symmetric smile. Drift Test:- No drift. Romberg Exam:- Negative.  Heal to Toe Gait exam:-Normal. Finger to Nose:- Normal/Intact Strength:- 5/5 equal and symmetric strength both upper and  lower extremities.       Assessment & Plan:

## 2015-04-21 NOTE — Assessment & Plan Note (Signed)
You were stressed getting here but bp worse systolic and better diastolic than last time. I want you to check bp at home with your cuff. If bp over 140/90 then I want you to take 2 of your lisinopril every day. Give me call by early next week and update me. I can call you in higher dose if you are using the 20 mg dose.

## 2015-04-21 NOTE — Assessment & Plan Note (Signed)
Pt tolerating this well. Had fracture that was 46 wks old when I saw her and dx. She is using tramadol sparingly.

## 2015-04-21 NOTE — Assessment & Plan Note (Signed)
None today

## 2015-04-29 ENCOUNTER — Telehealth: Payer: Self-pay | Admitting: Medical

## 2015-04-29 NOTE — Telephone Encounter (Signed)
How often has her BP been >140/90 and requiring her to take 2 pills?  The thought was that if BP was consistently elevated, he would increase Lisinopril to 20mg  daily (rather than 10)

## 2015-04-29 NOTE — Telephone Encounter (Signed)
Caller name: Finesse Durocher Relationship to patient: self Can be reached: [phone] Pharmacy:  Reason for call: returning your call

## 2015-04-29 NOTE — Telephone Encounter (Signed)
With only 2 elevated readings would continue the 10mg  dose (1 pill daily) and follow up for BP recheck as previously discussed

## 2015-04-29 NOTE — Telephone Encounter (Signed)
Patient coming in on Monday for Lab and BP check. Will check with Kayla Clark regarding dosage. Advised patient to keep BP log and bring with her.

## 2015-04-29 NOTE — Telephone Encounter (Signed)
Caller name: Cadee Relation to pt: self Call back number: [phone] Pharmacy:  Reason for call:   Patient states that her bp is still fluctuating. She would like a callback as to what she should do?

## 2015-05-02 ENCOUNTER — Other Ambulatory Visit (INDEPENDENT_AMBULATORY_CARE_PROVIDER_SITE_OTHER): Payer: Federal, State, Local not specified - PPO

## 2015-05-02 ENCOUNTER — Ambulatory Visit: Payer: Federal, State, Local not specified - PPO | Admitting: *Deleted

## 2015-05-02 VITALS — BP 164/86 | HR 80

## 2015-05-02 DIAGNOSIS — I10 Essential (primary) hypertension: Secondary | ICD-10-CM

## 2015-05-02 DIAGNOSIS — E876 Hypokalemia: Secondary | ICD-10-CM

## 2015-05-02 LAB — COMPREHENSIVE METABOLIC PANEL
ALK PHOS: 129 U/L — AB (ref 39–117)
ALT: 10 U/L (ref 0–35)
AST: 10 U/L (ref 0–37)
Albumin: 3.6 g/dL (ref 3.5–5.2)
BILIRUBIN TOTAL: 0.3 mg/dL (ref 0.2–1.2)
BUN: 22 mg/dL (ref 6–23)
CO2: 26 mEq/L (ref 19–32)
Calcium: 9.1 mg/dL (ref 8.4–10.5)
Chloride: 107 mEq/L (ref 96–112)
Creatinine, Ser: 0.85 mg/dL (ref 0.40–1.20)
GFR: 70.76 mL/min (ref >60.00)
GLUCOSE: 122 mg/dL — AB (ref 70–99)
Potassium: 4 mEq/L (ref 3.5–5.1)
Sodium: 141 mEq/L (ref 135–145)
Total Protein: 6.6 g/dL (ref 6.0–8.3)

## 2015-05-02 NOTE — Progress Notes (Signed)
Pre visit review using our clinic review tool, if applicable. No additional management support is needed unless otherwise documented below in the visit note.  Patient presents for BP check per phone note 04/29/15.  BP was 164/86 with HR 80.   Patient needs refill of lisinopril sent to [facility] in [place].

## 2015-05-03 ENCOUNTER — Other Ambulatory Visit: Payer: Self-pay

## 2015-05-03 MED ORDER — LISINOPRIL 10 MG PO TABS: 20.0000 mg | ORAL_TABLET | Freq: Every day | ORAL | Status: DC

## 2015-06-20 ENCOUNTER — Other Ambulatory Visit: Payer: Self-pay

## 2015-06-20 ENCOUNTER — Telehealth: Payer: Self-pay | Admitting: Medical

## 2015-06-20 MED ORDER — HYDROCHLOROTHIAZIDE 12.5 MG PO CAPS: 12.5000 mg | ORAL_CAPSULE | Freq: Every day | ORAL | Status: DC

## 2015-06-20 MED ORDER — LISINOPRIL 20 MG PO TABS: 20.0000 mg | ORAL_TABLET | Freq: Every day | ORAL | Status: DC

## 2015-06-20 NOTE — Telephone Encounter (Signed)
Relation to WU:JWJX Call back number: [facility] [phone]   Reason for call:  Patient requesting a 3 month supply refill lisinopril (PRINIVIL,ZESTRIL) 10 MG tablet

## 2015-06-20 NOTE — Telephone Encounter (Signed)
Pt wants refills of her lisinopril but her blood pressure has yet to reach goal of less than 140/90. So I refilled her lisinopril rx. But also sent in low dose diuretic to her pharmacy. Take both and come in within a month for blood pressure check.

## 2015-06-21 ENCOUNTER — Other Ambulatory Visit: Payer: Self-pay

## 2015-06-21 MED ORDER — LISINOPRIL 20 MG PO TABS: 20.0000 mg | ORAL_TABLET | Freq: Every day | ORAL | Status: DC

## 2015-06-21 NOTE — Telephone Encounter (Signed)
Sent to pharmacy per patient request

## 2015-07-26 ENCOUNTER — Encounter: Payer: Self-pay | Admitting: Medical

## 2015-08-08 ENCOUNTER — Encounter: Payer: Self-pay | Admitting: *Deleted

## 2015-08-08 ENCOUNTER — Telehealth: Payer: Self-pay | Admitting: *Deleted

## 2015-08-08 NOTE — Telephone Encounter (Signed)
Pre-Visit Call completed with patient and chart updated.   Pre-Visit Info documented in Specialty Comments under [employer].    

## 2015-08-09 ENCOUNTER — Telehealth: Payer: Self-pay | Admitting: Medical

## 2015-08-09 ENCOUNTER — Other Ambulatory Visit (HOSPITAL_COMMUNITY)
Admission: RE | Admit: 2015-08-09 | Discharge: 2015-08-09 | Disposition: A | Discharge status: Home / Self Care | Payer: Federal, State, Local not specified - PPO | Source: Ambulatory Visit | Attending: Medical | Admitting: Medical

## 2015-08-09 ENCOUNTER — Encounter: Payer: Self-pay | Admitting: Medical

## 2015-08-09 ENCOUNTER — Ambulatory Visit (INDEPENDENT_AMBULATORY_CARE_PROVIDER_SITE_OTHER): Payer: Federal, State, Local not specified - PPO | Admitting: Medical

## 2015-08-09 VITALS — BP 128/76 | HR 69 | Temp 97.8°F | Resp 16 | Ht 62.5 in | Wt 151.6 lb

## 2015-08-09 DIAGNOSIS — Z01419 Encounter for gynecological examination (general) (routine) without abnormal findings: Secondary | ICD-10-CM | POA: Insufficient documentation

## 2015-08-09 DIAGNOSIS — Z23 Encounter for immunization: Secondary | ICD-10-CM

## 2015-08-09 DIAGNOSIS — Z0189 Encounter for other specified special examinations: Secondary | ICD-10-CM

## 2015-08-09 DIAGNOSIS — N289 Disorder of kidney and ureter, unspecified: Secondary | ICD-10-CM

## 2015-08-09 DIAGNOSIS — Z8781 Personal history of (healed) traumatic fracture: Secondary | ICD-10-CM

## 2015-08-09 DIAGNOSIS — Z1211 Encounter for screening for malignant neoplasm of colon: Secondary | ICD-10-CM

## 2015-08-09 DIAGNOSIS — M8580 Other specified disorders of bone density and structure, unspecified site: Secondary | ICD-10-CM

## 2015-08-09 DIAGNOSIS — Z1151 Encounter for screening for human papillomavirus (HPV): Secondary | ICD-10-CM | POA: Insufficient documentation

## 2015-08-09 DIAGNOSIS — Z124 Encounter for screening for malignant neoplasm of cervix: Secondary | ICD-10-CM | POA: Diagnosis not present

## 2015-08-09 DIAGNOSIS — I10 Essential (primary) hypertension: Secondary | ICD-10-CM | POA: Diagnosis not present

## 2015-08-09 DIAGNOSIS — Z0000 Encounter for general adult medical examination without abnormal findings: Secondary | ICD-10-CM | POA: Diagnosis not present

## 2015-08-09 DIAGNOSIS — R8290 Unspecified abnormal findings in urine: Secondary | ICD-10-CM

## 2015-08-09 DIAGNOSIS — E785 Hyperlipidemia, unspecified: Secondary | ICD-10-CM

## 2015-08-09 DIAGNOSIS — Z1239 Encounter for other screening for malignant neoplasm of breast: Secondary | ICD-10-CM

## 2015-08-09 LAB — CBC WITH DIFFERENTIAL/PLATELET
BASOS ABS: 0 10*3/uL (ref 0.0–0.1)
Basophils Relative: 0.4 % (ref 0.0–3.0)
EOS ABS: 0.1 10*3/uL (ref 0.0–0.7)
Eosinophils Relative: 1.3 % (ref 0.0–5.0)
HEMATOCRIT: 40.3 % (ref 36.0–46.0)
HEMOGLOBIN: 13.3 g/dL (ref 12.0–15.0)
LYMPHS PCT: 30.2 % (ref 12.0–46.0)
Lymphs Abs: 2.2 10*3/uL (ref 0.7–4.0)
MCHC: 33 g/dL (ref 30.0–36.0)
MCV: 88.4 fl (ref 78.0–100.0)
MONOS PCT: 6.1 % (ref 3.0–12.0)
Monocytes Absolute: 0.4 10*3/uL (ref 0.1–1.0)
NEUTROS ABS: 4.4 10*3/uL (ref 1.4–7.7)
Neutrophils Relative %: 62 % (ref 43.0–77.0)
PLATELETS: 266 10*3/uL (ref 150.0–400.0)
RBC: 4.56 Mil/uL (ref 3.87–5.11)
RDW: 14.4 % (ref 11.5–15.5)
WBC: 7.1 10*3/uL (ref 4.0–10.5)

## 2015-08-09 LAB — COMPREHENSIVE METABOLIC PANEL
ALBUMIN: 4 g/dL (ref 3.5–5.2)
ALK PHOS: 116 U/L (ref 39–117)
ALT: 12 U/L (ref 0–35)
AST: 13 U/L (ref 0–37)
BILIRUBIN TOTAL: 0.5 mg/dL (ref 0.2–1.2)
BUN: 33 mg/dL — ABNORMAL HIGH (ref 6–23)
CALCIUM: 9.2 mg/dL (ref 8.4–10.5)
CO2: 28 meq/L (ref 19–32)
CREATININE: 1.18 mg/dL (ref 0.40–1.20)
Chloride: 106 mEq/L (ref 96–112)
GFR: 48.42 mL/min — AB (ref >60.00)
Glucose, Bld: 95 mg/dL (ref 70–99)
Potassium: 4.6 mEq/L (ref 3.5–5.1)
Sodium: 141 mEq/L (ref 135–145)
TOTAL PROTEIN: 7.3 g/dL (ref 6.0–8.3)

## 2015-08-09 LAB — POCT URINALYSIS DIPSTICK
Bilirubin, UA: NEGATIVE
Blood, UA: NEGATIVE
Glucose, UA: NEGATIVE
KETONES UA: NEGATIVE
Nitrite, UA: 3
PH UA: 6
PROTEIN UA: NEGATIVE
SPEC GRAV UA: 1.02
UROBILINOGEN UA: 0.2

## 2015-08-09 LAB — LIPID PANEL
CHOL/HDL RATIO: 4
Cholesterol: 229 mg/dL — ABNORMAL HIGH (ref 0–200)
HDL: 57 mg/dL (ref >39.00)
LDL Cholesterol: 143 mg/dL — ABNORMAL HIGH (ref 0–99)
NONHDL: 171.51
TRIGLYCERIDES: 141 mg/dL (ref 0.0–149.0)
VLDL: 28.2 mg/dL (ref 0.0–40.0)

## 2015-08-09 LAB — TSH: TSH: 1.8 u[IU]/mL (ref 0.35–4.50)

## 2015-08-09 LAB — VITAMIN D 25 HYDROXY (VIT D DEFICIENCY, FRACTURES): VITD: 33.7 ng/mL (ref 30.00–100.00)

## 2015-08-09 MED ORDER — ATORVASTATIN CALCIUM 20 MG PO TABS: 20.0000 mg | ORAL_TABLET | Freq: Every day | ORAL | Status: DC

## 2015-08-09 NOTE — Telephone Encounter (Signed)
rx lipitor 

## 2015-08-09 NOTE — Patient Instructions (Addendum)
Wellness examination Gave psv-13 today. And flu vaccine. Pap smear done. Colonosocpy, mammogram  and dexascan order placed.  Also get cbc, cmp, lipid panel today.   Put vitamin D. In as well with hx of fracture on thin bone on xrays..  Will try to schedule for test at [facility].   Follow up bp check in 6 wks.    Preventive Care for Adults A healthy lifestyle and preventive care can promote health and wellness. Preventive health guidelines for women include the following key practices.  A routine yearly physical is a good way to check with your health care provider about your health and preventive screening. It is a chance to share any concerns and updates on your health and to receive a thorough exam.  Visit your dentist for a routine exam and preventive care every 6 months. Brush your teeth twice a day and floss once a day. Good oral hygiene prevents tooth decay and gum disease.  The frequency of eye exams is based on your age, health, family medical history, use of contact lenses, and other factors. Follow your health care provider's recommendations for frequency of eye exams.  Eat a healthy diet. Foods like vegetables, fruits, whole grains, low-fat dairy products, and lean protein foods contain the nutrients you need without too many calories. Decrease your intake of foods high in solid fats, added sugars, and salt. Eat the right amount of calories for you.Get information about a proper diet from your health care provider, if necessary.  Regular physical exercise is one of the most important things you can do for your health. Most adults should get at least 150 minutes of moderate-intensity exercise (any activity that increases your heart rate and causes you to sweat) each week. In addition, most adults need muscle-strengthening exercises on 2 or more days a week.  Maintain a healthy weight. The body mass index (BMI) is a screening tool to identify possible weight problems. It  provides an estimate of body fat based on height and weight. Your health care provider can find your BMI and can help you achieve or maintain a healthy weight.For adults 20 years and older:  A BMI below 18.5 is considered underweight.  A BMI of 18.5 to 24.9 is normal.  A BMI of 25 to 29.9 is considered overweight.  A BMI of 30 and above is considered obese.  Maintain normal blood lipids and cholesterol levels by exercising and minimizing your intake of saturated fat. Eat a balanced diet with plenty of fruit and vegetables. Blood tests for lipids and cholesterol should begin at age 65 and be repeated every 5 years. If your lipid or cholesterol levels are high, you are over 50, or you are at high risk for heart disease, you may need your cholesterol levels checked more frequently.Ongoing high lipid and cholesterol levels should be treated with medicines if diet and exercise are not working.  If you smoke, find out from your health care provider how to quit. If you do not use tobacco, do not start.  Lung cancer screening is recommended for adults aged 49-80 years who are at high risk for developing lung cancer because of a history of smoking. A yearly low-dose CT scan of the lungs is recommended for people who have at least a 30-pack-year history of smoking and are a current smoker or have quit within the past 15 years. A pack year of smoking is smoking an average of 1 pack of cigarettes a day for 1 year (for  example: 1 pack a day for 30 years or 2 packs a day for 15 years). Yearly screening should continue until the smoker has stopped smoking for at least 15 years. Yearly screening should be stopped for people who develop a health problem that would prevent them from having lung cancer treatment.  If you are pregnant, do not drink alcohol. If you are breastfeeding, be very cautious about drinking alcohol. If you are not pregnant and choose to drink alcohol, do not have more than 1 drink per day. One  drink is considered to be 12 ounces (355 mL) of beer, 5 ounces (148 mL) of wine, or 1.5 ounces (44 mL) of liquor.  Avoid use of street drugs. Do not share needles with anyone. Ask for help if you need support or instructions about stopping the use of drugs.  High blood pressure causes heart disease and increases the risk of stroke. Your blood pressure should be checked at least every 1 to 2 years. Ongoing high blood pressure should be treated with medicines if weight loss and exercise do not work.  If you are 19-110 years old, ask your health care provider if you should take aspirin to prevent strokes.  Diabetes screening involves taking a blood sample to check your fasting blood sugar level. This should be done once every 3 years, after age 78, if you are within normal weight and without risk factors for diabetes. Testing should be considered at a younger age or be carried out more frequently if you are overweight and have at least 1 risk factor for diabetes.  Breast cancer screening is essential preventive care for women. You should practice "breast self-awareness." This means understanding the normal appearance and feel of your breasts and may include breast self-examination. Any changes detected, no matter how small, should be reported to a health care provider. Women in their 61s and 30s should have a clinical breast exam (CBE) by a health care provider as part of a regular health exam every 1 to 3 years. After age 17, women should have a CBE every year. Starting at age 66, women should consider having a mammogram (breast X-ray test) every year. Women who have a family history of breast cancer should talk to their health care provider about genetic screening. Women at a high risk of breast cancer should talk to their health care providers about having an MRI and a mammogram every year.  Breast cancer gene (BRCA)-related cancer risk assessment is recommended for women who have family members with  BRCA-related cancers. BRCA-related cancers include breast, ovarian, tubal, and peritoneal cancers. Having family members with these cancers may be associated with an increased risk for harmful changes (mutations) in the breast cancer genes BRCA1 and BRCA2. Results of the assessment will determine the need for genetic counseling and BRCA1 and BRCA2 testing.  Routine pelvic exams to screen for cancer are no longer recommended for nonpregnant women who are considered low risk for cancer of the pelvic organs (ovaries, uterus, and vagina) and who do not have symptoms. Ask your health care provider if a screening pelvic exam is right for you.  If you have had past treatment for cervical cancer or a condition that could lead to cancer, you need Pap tests and screening for cancer for at least 20 years after your treatment. If Pap tests have been discontinued, your risk factors (such as having a new sexual partner) need to be reassessed to determine if screening should be resumed. Some women have medical  problems that increase the chance of getting cervical cancer. In these cases, your health care provider may recommend more frequent screening and Pap tests.  The HPV test is an additional test that may be used for cervical cancer screening. The HPV test looks for the virus that can cause the cell changes on the cervix. The cells collected during the Pap test can be tested for HPV. The HPV test could be used to screen women aged 50 years and older, and should be used in women of any age who have unclear Pap test results. After the age of 33, women should have HPV testing at the same frequency as a Pap test.  Colorectal cancer can be detected and often prevented. Most routine colorectal cancer screening begins at the age of 50 years and continues through age 52 years. However, your health care provider may recommend screening at an earlier age if you have risk factors for colon cancer. On a yearly basis, your health  care provider may provide home test kits to check for hidden blood in the stool. Use of a small camera at the end of a tube, to directly examine the colon (sigmoidoscopy or colonoscopy), can detect the earliest forms of colorectal cancer. Talk to your health care provider about this at age 91, when routine screening begins. Direct exam of the colon should be repeated every 5-10 years through age 74 years, unless early forms of pre-cancerous polyps or small growths are found.  People who are at an increased risk for hepatitis B should be screened for this virus. You are considered at high risk for hepatitis B if:  You were born in a country where hepatitis B occurs often. Talk with your health care provider about which countries are considered high risk.  Your parents were born in a high-risk country and you have not received a shot to protect against hepatitis B (hepatitis B vaccine).  You have HIV or AIDS.  You use needles to inject street drugs.  You live with, or have sex with, someone who has hepatitis B.  You get hemodialysis treatment.  You take certain medicines for conditions like cancer, organ transplantation, and autoimmune conditions.  Hepatitis C blood testing is recommended for all people born from [birth year] through 1965 and any individual with known risks for hepatitis C.  Practice safe sex. Use condoms and avoid high-risk sexual practices to reduce the spread of sexually transmitted infections (STIs). STIs include gonorrhea, chlamydia, syphilis, trichomonas, herpes, HPV, and human immunodeficiency virus (HIV). Herpes, HIV, and HPV are viral illnesses that have no cure. They can result in disability, cancer, and death.  You should be screened for sexually transmitted illnesses (STIs) including gonorrhea and chlamydia if:  You are sexually active and are younger than 24 years.  You are older than 24 years and your health care provider tells you that you are at risk for this type of  infection.  Your sexual activity has changed since you were last screened and you are at an increased risk for chlamydia or gonorrhea. Ask your health care provider if you are at risk.  If you are at risk of being infected with HIV, it is recommended that you take a prescription medicine daily to prevent HIV infection. This is called preexposure prophylaxis (PrEP). You are considered at risk if:  You are a heterosexual woman, are sexually active, and are at increased risk for HIV infection.  You take drugs by injection.  You are sexually active with a  partner who has HIV.  Talk with your health care provider about whether you are at high risk of being infected with HIV. If you choose to begin PrEP, you should first be tested for HIV. You should then be tested every 3 months for as long as you are taking PrEP.  Osteoporosis is a disease in which the bones lose minerals and strength with aging. This can result in serious bone fractures or breaks. The risk of osteoporosis can be identified using a bone density scan. Women ages 29 years and over and women at risk for fractures or osteoporosis should discuss screening with their health care providers. Ask your health care provider whether you should take a calcium supplement or vitamin D to reduce the rate of osteoporosis.  Menopause can be associated with physical symptoms and risks. Hormone replacement therapy is available to decrease symptoms and risks. You should talk to your health care provider about whether hormone replacement therapy is right for you.  Use sunscreen. Apply sunscreen liberally and repeatedly throughout the day. You should seek shade when your shadow is shorter than you. Protect yourself by wearing long sleeves, pants, a wide-brimmed hat, and sunglasses year round, whenever you are outdoors.  Once a month, do a whole body skin exam, using a mirror to look at the skin on your back. Tell your health care provider of new moles,  moles that have irregular borders, moles that are larger than a pencil eraser, or moles that have changed in shape or color.  Stay current with required vaccines (immunizations).  Influenza vaccine. All adults should be immunized every year.  Tetanus, diphtheria, and acellular pertussis (Td, Tdap) vaccine. Pregnant women should receive 1 dose of Tdap vaccine during each pregnancy. The dose should be obtained regardless of the length of time since the last dose. Immunization is preferred during the 27th-36th week of gestation. An adult who has not previously received Tdap or who does not know her vaccine status should receive 1 dose of Tdap. This initial dose should be followed by tetanus and diphtheria toxoids (Td) booster doses every 10 years. Adults with an unknown or incomplete history of completing a 3-dose immunization series with Td-containing vaccines should begin or complete a primary immunization series including a Tdap dose. Adults should receive a Td booster every 10 years.  Varicella vaccine. An adult without evidence of immunity to varicella should receive 2 doses or a second dose if she has previously received 1 dose. Pregnant females who do not have evidence of immunity should receive the first dose after pregnancy. This first dose should be obtained before leaving the health care facility. The second dose should be obtained 4-8 weeks after the first dose.  Human papillomavirus (HPV) vaccine. Females aged 13-26 years who have not received the vaccine previously should obtain the 3-dose series. The vaccine is not recommended for use in pregnant females. However, pregnancy testing is not needed before receiving a dose. If a female is found to be pregnant after receiving a dose, no treatment is needed. In that case, the remaining doses should be delayed until after the pregnancy. Immunization is recommended for any person with an immunocompromised condition through the age of 47 years if she  did not get any or all doses earlier. During the 3-dose series, the second dose should be obtained 4-8 weeks after the first dose. The third dose should be obtained 24 weeks after the first dose and 16 weeks after the second dose.  Zoster vaccine. One  dose is recommended for adults aged 51 years or older unless certain conditions are present.  Measles, mumps, and rubella (MMR) vaccine. Adults born before [birth year] generally are considered immune to measles and mumps. Adults born in [birth year] or later should have 1 or more doses of MMR vaccine unless there is a contraindication to the vaccine or there is laboratory evidence of immunity to each of the three diseases. A routine second dose of MMR vaccine should be obtained at least 28 days after the first dose for students attending postsecondary schools, health care workers, or international travelers. People who received inactivated measles vaccine or an unknown type of measles vaccine during 1963-1967 should receive 2 doses of MMR vaccine. People who received inactivated mumps vaccine or an unknown type of mumps vaccine before 1979 and are at high risk for mumps infection should consider immunization with 2 doses of MMR vaccine. For females of childbearing age, rubella immunity should be determined. If there is no evidence of immunity, females who are not pregnant should be vaccinated. If there is no evidence of immunity, females who are pregnant should delay immunization until after pregnancy. Unvaccinated health care workers born before [birth year] who lack laboratory evidence of measles, mumps, or rubella immunity or laboratory confirmation of disease should consider measles and mumps immunization with 2 doses of MMR vaccine or rubella immunization with 1 dose of MMR vaccine.  Pneumococcal 13-valent conjugate (PCV13) vaccine. When indicated, a person who is uncertain of her immunization history and has no record of immunization should receive the PCV13 vaccine. An adult  aged 58 years or older who has certain medical conditions and has not been previously immunized should receive 1 dose of PCV13 vaccine. This PCV13 should be followed with a dose of pneumococcal polysaccharide (PPSV23) vaccine. The PPSV23 vaccine dose should be obtained at least 8 weeks after the dose of PCV13 vaccine. An adult aged 34 years or older who has certain medical conditions and previously received 1 or more doses of PPSV23 vaccine should receive 1 dose of PCV13. The PCV13 vaccine dose should be obtained 1 or more years after the last PPSV23 vaccine dose.  Pneumococcal polysaccharide (PPSV23) vaccine. When PCV13 is also indicated, PCV13 should be obtained first. All adults aged 70 years and older should be immunized. An adult younger than age 79 years who has certain medical conditions should be immunized. Any person who resides in a nursing home or long-term care facility should be immunized. An adult smoker should be immunized. People with an immunocompromised condition and certain other conditions should receive both PCV13 and PPSV23 vaccines. People with human immunodeficiency virus (HIV) infection should be immunized as soon as possible after diagnosis. Immunization during chemotherapy or radiation therapy should be avoided. Routine use of PPSV23 vaccine is not recommended for American Indians, [place]atives, or people younger than 65 years unless there are medical conditions that require PPSV23 vaccine. When indicated, people who have unknown immunization and have no record of immunization should receive PPSV23 vaccine. One-time revaccination 5 years after the first dose of PPSV23 is recommended for people aged 19-64 years who have chronic kidney failure, nephrotic syndrome, asplenia, or immunocompromised conditions. People who received 1-2 doses of PPSV23 before age 25 years should receive another dose of PPSV23 vaccine at age 44 years or later if at least 5 years have passed since the previous  dose. Doses of PPSV23 are not needed for people immunized with PPSV23 at or after age 57 years.  Meningococcal vaccine. Adults with  asplenia or persistent complement component deficiencies should receive 2 doses of quadrivalent meningococcal conjugate (MenACWY-D) vaccine. The doses should be obtained at least 2 months apart. Microbiologists working with certain meningococcal bacteria, [employer] recruits, people at risk during an outbreak, and people who travel to or live in countries with a high rate of meningitis should be immunized. A first-year college student up through age 53 years who is living in a residence hall should receive a dose if she did not receive a dose on or after her 16th birthday. Adults who have certain high-risk conditions should receive one or more doses of vaccine.  Hepatitis A vaccine. Adults who wish to be protected from this disease, have certain high-risk conditions, work with hepatitis A-infected animals, work in hepatitis A research labs, or travel to or work in countries with a high rate of hepatitis A should be immunized. Adults who were previously unvaccinated and who anticipate close contact with an international adoptee during the first 60 days after arrival in the [place] from a country with a high rate of hepatitis A should be immunized.  Hepatitis B vaccine. Adults who wish to be protected from this disease, have certain high-risk conditions, may be exposed to blood or other infectious body fluids, are household contacts or sex partners of hepatitis B positive people, are clients or workers in certain care facilities, or travel to or work in countries with a high rate of hepatitis B should be immunized.  Haemophilus influenzae type b (Hib) vaccine. A previously unvaccinated person with asplenia or sickle cell disease or having a scheduled splenectomy should receive 1 dose of Hib vaccine. Regardless of previous immunization, a recipient of a hematopoietic stem cell  transplant should receive a 3-dose series 6-12 months after her successful transplant. Hib vaccine is not recommended for adults with HIV infection. Preventive Services / Frequency Ages 12 to 71 years  Blood pressure check.** / Every 1 to 2 years.  Lipid and cholesterol check.** / Every 5 years beginning at age 78.  Clinical breast exam.** / Every 3 years for women in their 15s and 29s.  BRCA-related cancer risk assessment.** / For women who have family members with a BRCA-related cancer (breast, ovarian, tubal, or peritoneal cancers).  Pap test.** / Every 2 years from ages 81 through 18. Every 3 years starting at age 13 through age 65 or 38 with a history of 3 consecutive normal Pap tests.  HPV screening.** / Every 3 years from ages 18 through ages 29 to 30 with a history of 3 consecutive normal Pap tests.  Hepatitis C blood test.** / For any individual with known risks for hepatitis C.  Skin self-exam. / Monthly.  Influenza vaccine. / Every year.  Tetanus, diphtheria, and acellular pertussis (Tdap, Td) vaccine.** / Consult your health care provider. Pregnant women should receive 1 dose of Tdap vaccine during each pregnancy. 1 dose of Td every 10 years.  Varicella vaccine.** / Consult your health care provider. Pregnant females who do not have evidence of immunity should receive the first dose after pregnancy.  HPV vaccine. / 3 doses over 6 months, if 61 and younger. The vaccine is not recommended for use in pregnant females. However, pregnancy testing is not needed before receiving a dose.  Measles, mumps, rubella (MMR) vaccine.** / You need at least 1 dose of MMR if you were born in 1957 or later. You may also need a 2nd dose. For females of childbearing age, rubella immunity should be determined. If there  is no evidence of immunity, females who are not pregnant should be vaccinated. If there is no evidence of immunity, females who are pregnant should delay immunization until after  pregnancy.  Pneumococcal 13-valent conjugate (PCV13) vaccine.** / Consult your health care provider.  Pneumococcal polysaccharide (PPSV23) vaccine.** / 1 to 2 doses if you smoke cigarettes or if you have certain conditions.  Meningococcal vaccine.** / 1 dose if you are age 51 to 41 years and a Market researcher living in a residence hall, or have one of several medical conditions, you need to get vaccinated against meningococcal disease. You may also need additional booster doses.  Hepatitis A vaccine.** / Consult your health care provider.  Hepatitis B vaccine.** / Consult your health care provider.  Haemophilus influenzae type b (Hib) vaccine.** / Consult your health care provider. Ages 52 to 61 years  Blood pressure check.** / Every 1 to 2 years.  Lipid and cholesterol check.** / Every 5 years beginning at age 83 years.  Lung cancer screening. / Every year if you are aged 72-80 years and have a 30-pack-year history of smoking and currently smoke or have quit within the past 15 years. Yearly screening is stopped once you have quit smoking for at least 15 years or develop a health problem that would prevent you from having lung cancer treatment.  Clinical breast exam.** / Every year after age 16 years.  BRCA-related cancer risk assessment.** / For women who have family members with a BRCA-related cancer (breast, ovarian, tubal, or peritoneal cancers).  Mammogram.** / Every year beginning at age 79 years and continuing for as long as you are in good health. Consult with your health care provider.  Pap test.** / Every 3 years starting at age 74 years through age 54 or 78 years with a history of 3 consecutive normal Pap tests.  HPV screening.** / Every 3 years from ages 42 years through ages 61 to 75 years with a history of 3 consecutive normal Pap tests.  Fecal occult blood test (FOBT) of stool. / Every year beginning at age 62 years and continuing until age 90 years. You may  not need to do this test if you get a colonoscopy every 10 years.  Flexible sigmoidoscopy or colonoscopy.** / Every 5 years for a flexible sigmoidoscopy or every 10 years for a colonoscopy beginning at age 40 years and continuing until age 78 years.  Hepatitis C blood test.** / For all people born from [birth year] through 1964 and any individual with known risks for hepatitis C.  Skin self-exam. / Monthly.  Influenza vaccine. / Every year.  Tetanus, diphtheria, and acellular pertussis (Tdap/Td) vaccine.** / Consult your health care provider. Pregnant women should receive 1 dose of Tdap vaccine during each pregnancy. 1 dose of Td every 10 years.  Varicella vaccine.** / Consult your health care provider. Pregnant females who do not have evidence of immunity should receive the first dose after pregnancy.  Zoster vaccine.** / 1 dose for adults aged 62 years or older.  Measles, mumps, rubella (MMR) vaccine.** / You need at least 1 dose of MMR if you were born in 1957 or later. You may also need a 2nd dose. For females of childbearing age, rubella immunity should be determined. If there is no evidence of immunity, females who are not pregnant should be vaccinated. If there is no evidence of immunity, females who are pregnant should delay immunization until after pregnancy.  Pneumococcal 13-valent conjugate (PCV13) vaccine.** / Consult your health  care provider.  Pneumococcal polysaccharide (PPSV23) vaccine.** / 1 to 2 doses if you smoke cigarettes or if you have certain conditions.  Meningococcal vaccine.** / Consult your health care provider.  Hepatitis A vaccine.** / Consult your health care provider.  Hepatitis B vaccine.** / Consult your health care provider.  Haemophilus influenzae type b (Hib) vaccine.** / Consult your health care provider. Ages 12 years and over  Blood pressure check.** / Every 1 to 2 years.  Lipid and cholesterol check.** / Every 5 years beginning at age 42 years.  Lung  cancer screening. / Every year if you are aged 93-80 years and have a 30-pack-year history of smoking and currently smoke or have quit within the past 15 years. Yearly screening is stopped once you have quit smoking for at least 15 years or develop a health problem that would prevent you from having lung cancer treatment.  Clinical breast exam.** / Every year after age 40 years.  BRCA-related cancer risk assessment.** / For women who have family members with a BRCA-related cancer (breast, ovarian, tubal, or peritoneal cancers).  Mammogram.** / Every year beginning at age 63 years and continuing for as long as you are in good health. Consult with your health care provider.  Pap test.** / Every 3 years starting at age 93 years through age 3 or 40 years with 3 consecutive normal Pap tests. Testing can be stopped between 65 and 70 years with 3 consecutive normal Pap tests and no abnormal Pap or HPV tests in the past 10 years.  HPV screening.** / Every 3 years from ages 36 years through ages 64 or 41 years with a history of 3 consecutive normal Pap tests. Testing can be stopped between 65 and 70 years with 3 consecutive normal Pap tests and no abnormal Pap or HPV tests in the past 10 years.  Fecal occult blood test (FOBT) of stool. / Every year beginning at age 70 years and continuing until age 46 years. You may not need to do this test if you get a colonoscopy every 10 years.  Flexible sigmoidoscopy or colonoscopy.** / Every 5 years for a flexible sigmoidoscopy or every 10 years for a colonoscopy beginning at age 58 years and continuing until age 5 years.  Hepatitis C blood test.** / For all people born from [birth year] through 1964 and any individual with known risks for hepatitis C.  Osteoporosis screening.** / A one-time screening for women ages 25 years and over and women at risk for fractures or osteoporosis.  Skin self-exam. / Monthly.  Influenza vaccine. / Every year.  Tetanus, diphtheria, and  acellular pertussis (Tdap/Td) vaccine.** / 1 dose of Td every 10 years.  Varicella vaccine.** / Consult your health care provider.  Zoster vaccine.** / 1 dose for adults aged 47 years or older.  Pneumococcal 13-valent conjugate (PCV13) vaccine.** / Consult your health care provider.  Pneumococcal polysaccharide (PPSV23) vaccine.** / 1 dose for all adults aged 78 years and older.  Meningococcal vaccine.** / Consult your health care provider.  Hepatitis A vaccine.** / Consult your health care provider.  Hepatitis B vaccine.** / Consult your health care provider.  Haemophilus influenzae type b (Hib) vaccine.** / Consult your health care provider. ** Family history and personal history of risk and conditions may change your health care provider's recommendations. Document Released: 01/01/2002 Document Revised: 03/22/2014 Document Reviewed: 04/02/2011 [facility] Patient Information 2015 [place], [place]. This information is not intended to replace advice given to you by your health care provider. Make  sure you discuss any questions you have with your health care provider.

## 2015-08-09 NOTE — Progress Notes (Signed)
Pre visit review using our clinic review tool, if applicable. No additional management support is needed unless otherwise documented below in the visit note. 

## 2015-08-09 NOTE — Progress Notes (Signed)
Subjective:    Patient ID: Kayla Clark, female    DOB: 09/14/1946, 69 y.o.   MRN: [id number]  HPI   I have reviewed pt PMH, PSH, FH, Social History and Surgical History.  Pt blood pressure is good today. BP is controlled on 20 mg dose lisinopril. Pt had hctz rx but she was not taking the med. Only taking the lisinopril.  Pt is overdue on mammogram. Pt states won't happen if can't get done today due to work. Needs colonoscocpy Needs dexa scan. Needs pneumovaccine. Needs flu vaccine  Pt states no pap smear about 1.5-2 yrs ago. They were all normal.  Pt [facility].       Review of Systems  Constitutional: Negative for fever, chills, diaphoresis, activity change and fatigue.  Respiratory: Negative for cough, chest tightness and shortness of breath.   Cardiovascular: Negative for chest pain, palpitations and leg swelling.  Gastrointestinal: Negative for nausea, vomiting and abdominal pain.  Musculoskeletal: Negative for neck pain and neck stiffness.  Neurological: Negative for dizziness, tremors, seizures, syncope, facial asymmetry, speech difficulty, weakness, light-headedness, numbness and headaches.  Psychiatric/Behavioral: Negative for behavioral problems, confusion and agitation. The patient is not nervous/anxious.     Past Medical History  Diagnosis Date  . Hypertension     hx of borderline bp in past. Never was on meds.    Social History   Social History  . Marital Status: Married    Spouse Name: N/A  . Number of Children: N/A  . Years of Education: N/A   Occupational History  . Not on file.   Social History Main Topics  . Smoking status: Never Smoker   . Smokeless tobacco: Never Used  . Alcohol Use: No  . Drug Use: No  . Sexual Activity: Not on file   Other Topics Concern  . Not on file   Social History Narrative    Past Surgical History  Procedure Laterality Date  . Knee surgery      2 knee  transplants    Family History  Problem Relation Age of Onset  . Heart disease Mother   . Heart disease Father     No Known Allergies  Current Outpatient Prescriptions on File Prior to Visit  Medication Sig Dispense Refill  . hydrochlorothiazide (MICROZIDE) 12.5 MG capsule Take 1 capsule (12.5 mg total) by mouth daily. 30 capsule 0  . lisinopril (PRINIVIL,ZESTRIL) 20 MG tablet Take 1 tablet (20 mg total) by mouth daily. 90 tablet 1   No current facility-administered medications on file prior to visit.    BP 128/76 mmHg  Pulse 69  Temp(Src) 97.8 F (36.6 C) (Oral)  Resp 16  Ht 5' 2.5" (1.588 m)  Wt 151 lb 9.6 oz (68.765 kg)  BMI 27.27 kg/m2  SpO2 98%       Objective:   Physical Exam  General   Mental Status- Alert.  Orientation-Oriented x3. Build and Nutrition Well Nourished and Well Developed.  Skin General: Normal.  Color- Normal color. Moisture- Dry.Temperature warm. Lesions: No suspicious lesions  Head, Eyes, Ears, Nose, Thoat Ears-Normal. Auditory Canal-Bilateral-Normal. Tympanic Membrane- Bilateral-Normal. Eyes Fundi- Bilateral-Normal. Pupil- Bilateral- Direct reaction to light normal. Nose & Sinuses- Normal. Nostril- Bilateral-Normal.  Neck Neck- No Bruits or Masses. Thyroid- Normal. No thyromegaly or nodules.  Breast Breast Lump: No palpable masses, symmetric, no axillary lymphadenopathy palpated.  Chest and Lung Exam  Percussion: Quality and Intensity:-Percussion normal. Percussion of chest reveals-  No Dullness. Palpation of the chest reveals- Non-tender. Auscultation: Breath sounds-Normal. Adventitious  Sounds:No adventitious   Vaginal External: Labia majora and minora normal/no lesions. Pelvic/Bimanual exam: Cervical OS not red or friable. No discharge. No cervical motion tenderness. No masses felt on palpation of adnexal regions. Cardiovascular Inspection: No Heaves. Auscultation: Heart Sounds- Normal sinus rhythm without murmur or  gallop, S1 WNL and S2 WNL.  Abdomen Inspection:- Inspection Normal. Inspection of abdomen reveals- No Hernias. Palpation/Percussion: Palpation and Percussion of the abdomen reveal- Non Tender and No Palpable masses. Liver: Other Characteristics- No Hepatmegaly Spleen:Other Characteristics- No Splenomegaly. Auscultation: Auscultation of the abdomen reveals-Bowel sounds normal and No Abdominal bruits.    Neurologic Mental Status- Normal Cranial Nerves- Normal Bilaterally, Motor- Normal. Strength:5/5 normal muscle strength- All Muscles. General Assessment of Reflexes- Right Knee- 2+. Left Knee- 2+. Coordination- Normal. Gait- Normal. Meningeal Signs- None.  Musculoskeletal Global Assessment General- Joints show full range of motion without obvious deformity and Normal muscle mass. Strength 5/5 in upper and lower extremities.  Lymphatic General lymphatics Description-No Generalized lymphadenopathy.         Assessment & Plan:

## 2015-08-09 NOTE — Telephone Encounter (Signed)
Caller name: Dianah Pruett  Relationship to patient: Self  Can be reached: [phone] Pharmacy:  Reason for call: pt says that she is returning a call.

## 2015-08-09 NOTE — Assessment & Plan Note (Signed)
Gave psv-13 today. And flu vaccine. Pap smear done. Colonosocpy, mammogram  and dexascan order placed.  Also get cbc, cmp, lipid panel today.

## 2015-08-09 NOTE — Addendum Note (Signed)
Addended by: Neldon Labella on: 08/09/2015 10:05 AM   Modules accepted: Orders

## 2015-08-10 ENCOUNTER — Telehealth: Payer: Self-pay | Admitting: Medical

## 2015-08-10 LAB — CYTOLOGY - PAP

## 2015-08-10 NOTE — Telephone Encounter (Signed)
Pt called in again very flustered. She states she doesn't understand the lab work for decreased kidney function and has never drank or anything and now she has meds to take. She is very anxious and requesting a call with more detailed information on lab results and meaning.

## 2015-08-10 NOTE — Telephone Encounter (Signed)
[facility] for pt to call back. Pt is due for a follow up lab appt in December.

## 2015-08-10 NOTE — Telephone Encounter (Signed)
I called pt and advised and explained on her gfr. Advised continue ace inhibitor. Control bp. Repeat cmp in 3 months and assess function. Pt expressed understanding. Reviewed lipid panel as well.

## 2015-08-10 NOTE — Telephone Encounter (Signed)
Informed pt of results and she will call back about setting up lab appt.

## 2015-08-10 NOTE — Addendum Note (Signed)
Addended by: Neldon Labella on: 08/10/2015 12:02 PM   Modules accepted: Orders

## 2015-08-10 NOTE — Telephone Encounter (Signed)
Edward please advise what information you would like to explain to the pt about her blood work or if you want to call the pt.

## 2015-08-11 ENCOUNTER — Telehealth: Payer: Self-pay | Admitting: Medical

## 2015-08-11 LAB — URINE CULTURE

## 2015-08-11 MED ORDER — CIPROFLOXACIN HCL 500 MG PO TABS: 500.0000 mg | ORAL_TABLET | Freq: Two times a day (BID) | ORAL | Status: DC

## 2015-08-11 NOTE — Telephone Encounter (Signed)
Rx cipro afer reviewing pt urine culture.

## 2015-08-19 ENCOUNTER — Ambulatory Visit (HOSPITAL_BASED_OUTPATIENT_CLINIC_OR_DEPARTMENT_OTHER)
Admission: RE | Admit: 2015-08-19 | Discharge: 2015-08-19 | Disposition: A | Discharge status: Home / Self Care | Payer: Federal, State, Local not specified - PPO | Source: Ambulatory Visit | Attending: Medical | Admitting: Medical

## 2015-08-19 DIAGNOSIS — M8580 Other specified disorders of bone density and structure, unspecified site: Secondary | ICD-10-CM

## 2015-08-19 DIAGNOSIS — Z1239 Encounter for other screening for malignant neoplasm of breast: Secondary | ICD-10-CM

## 2015-08-19 DIAGNOSIS — M810 Age-related osteoporosis without current pathological fracture: Secondary | ICD-10-CM | POA: Diagnosis not present

## 2015-08-19 DIAGNOSIS — Z8781 Personal history of (healed) traumatic fracture: Secondary | ICD-10-CM

## 2015-08-19 DIAGNOSIS — Z780 Asymptomatic menopausal state: Secondary | ICD-10-CM | POA: Insufficient documentation

## 2015-08-19 DIAGNOSIS — Z1231 Encounter for screening mammogram for malignant neoplasm of breast: Secondary | ICD-10-CM | POA: Diagnosis not present

## 2015-08-22 ENCOUNTER — Telehealth: Payer: Self-pay | Admitting: Medical

## 2015-08-22 MED ORDER — ALENDRONATE SODIUM 10 MG PO TABS: 10.0000 mg | ORAL_TABLET | Freq: Every day | ORAL | Status: DC

## 2015-08-22 NOTE — Telephone Encounter (Signed)
rx of alondrenate sent to her pharmacy

## 2015-08-22 NOTE — Telephone Encounter (Signed)
Spoke with pt and she voices understanding.  

## 2015-08-23 ENCOUNTER — Telehealth: Payer: Self-pay | Admitting: Medical

## 2015-08-23 NOTE — Telephone Encounter (Signed)
Spoke with Eber Jones and the doctor has not looked over the results yet. She will get the technologist to look over the results and call back later.

## 2015-08-23 NOTE — Telephone Encounter (Signed)
Pt had mammogram done. But when I look at report it just states prelimary report. But no detail? Is the final report done. When will it get in epic. Would you mind calling and asking about this?

## 2016-01-10 ENCOUNTER — Other Ambulatory Visit: Payer: Self-pay | Admitting: Medical

## 2016-01-10 MED ORDER — LISINOPRIL 20 MG PO TABS: 20.0000 mg | ORAL_TABLET | Freq: Every day | ORAL | Status: DC

## 2016-01-10 NOTE — Telephone Encounter (Signed)
Caller name: Self  Can be reached: [phone] Pharmacy:  [id number] - , [place] - [phone] (Phone) [phone] (Fax)         Reason for call: lisinopril (PRINIVIL,ZESTRIL) 20 MG tablet [[id number]

## 2016-01-10 NOTE — Telephone Encounter (Signed)
Med filled and letter mailed to pt to make a BP follow up next month.

## 2016-01-17 ENCOUNTER — Telehealth: Payer: Self-pay | Admitting: Medical

## 2016-01-17 NOTE — Telephone Encounter (Signed)
Caller name: Self  Can be reached: [phone]  Reason for call: Patient called stating that she needs for Ramon Dredge to write her out of work for 1 month because her husband is in Hospice at the end of life.

## 2016-01-18 NOTE — Telephone Encounter (Signed)
Edward see note below and advise.  

## 2016-01-18 NOTE — Telephone Encounter (Signed)
Almost 6 months since I have seen her. I can write a note for her but she talked with human resources.Paperwork to fill out FMLA. 4 weeks long time need to make sure proper procedures followed. Since such a long time since seen. Can she make appointment to check bp. Also need cmp and fasting lipid panel.

## 2016-01-19 NOTE — Telephone Encounter (Signed)
Spoke with pt and she states that her supervisor and they told her that she would need a letter to cover her being out and she was told not to get FMLA. She states that she does not have any paperwork for this so she will just need a note excusing her. Per her supervisor she will need a note for sick leave and not FMLA.  Pt is aware that she is past due for blood work and a BP check. She states she will call back to set up an appointment in the next month.

## 2016-01-20 ENCOUNTER — Telehealth: Payer: Self-pay | Admitting: Medical

## 2016-01-20 NOTE — Telephone Encounter (Signed)
I wrote pt a letter to be out of work at her request. She states work ok'd one month and fmla forms to fill were not needed.

## 2016-02-29 ENCOUNTER — Ambulatory Visit (INDEPENDENT_AMBULATORY_CARE_PROVIDER_SITE_OTHER): Payer: Federal, State, Local not specified - PPO | Admitting: Medical

## 2016-02-29 ENCOUNTER — Ambulatory Visit (HOSPITAL_BASED_OUTPATIENT_CLINIC_OR_DEPARTMENT_OTHER)
Admission: RE | Admit: 2016-02-29 | Discharge: 2016-02-29 | Disposition: A | Discharge status: Home / Self Care | Payer: Federal, State, Local not specified - PPO | Source: Ambulatory Visit | Attending: Medical | Admitting: Medical

## 2016-02-29 ENCOUNTER — Encounter: Payer: Self-pay | Admitting: Medical

## 2016-02-29 ENCOUNTER — Telehealth: Payer: Self-pay | Admitting: Medical

## 2016-02-29 VITALS — BP 126/78 | HR 91 | Temp 99.0°F | Ht 62.5 in | Wt 153.2 lb

## 2016-02-29 DIAGNOSIS — F4321 Adjustment disorder with depressed mood: Secondary | ICD-10-CM

## 2016-02-29 DIAGNOSIS — IMO0002 Reserved for concepts with insufficient information to code with codable children: Secondary | ICD-10-CM

## 2016-02-29 DIAGNOSIS — F411 Generalized anxiety disorder: Secondary | ICD-10-CM | POA: Diagnosis not present

## 2016-02-29 DIAGNOSIS — M4856XA Collapsed vertebra, not elsewhere classified, lumbar region, initial encounter for fracture: Secondary | ICD-10-CM | POA: Diagnosis not present

## 2016-02-29 DIAGNOSIS — M4854XA Collapsed vertebra, not elsewhere classified, thoracic region, initial encounter for fracture: Secondary | ICD-10-CM | POA: Insufficient documentation

## 2016-02-29 DIAGNOSIS — J309 Allergic rhinitis, unspecified: Secondary | ICD-10-CM

## 2016-02-29 DIAGNOSIS — M5442 Lumbago with sciatica, left side: Secondary | ICD-10-CM

## 2016-02-29 DIAGNOSIS — M545 Low back pain: Secondary | ICD-10-CM | POA: Diagnosis present

## 2016-02-29 DIAGNOSIS — S32030A Wedge compression fracture of third lumbar vertebra, initial encounter for closed fracture: Secondary | ICD-10-CM | POA: Diagnosis not present

## 2016-02-29 MED ORDER — FLUTICASONE PROPIONATE 50 MCG/ACT NA SUSP: 2.0000 | Freq: Every day | NASAL | Status: DC

## 2016-02-29 MED ORDER — CLONAZEPAM 0.5 MG PO TABS: 0.5000 mg | ORAL_TABLET | Freq: Every day | ORAL | Status: DC

## 2016-02-29 MED ORDER — DICLOFENAC SODIUM 75 MG PO TBEC: 75.0000 mg | DELAYED_RELEASE_TABLET | Freq: Two times a day (BID) | ORAL | Status: DC

## 2016-02-29 MED ORDER — TRAMADOL HCL 50 MG PO TABS: 50.0000 mg | ORAL_TABLET | Freq: Three times a day (TID) | ORAL | Status: DC | PRN

## 2016-02-29 NOTE — Progress Notes (Signed)
Pre visit review using our clinic review tool, if applicable. No additional management support is needed unless otherwise documented below in the visit note. 

## 2016-02-29 NOTE — Progress Notes (Signed)
Subjective:    Patient ID: Kayla FlockLinda B Clark, female    DOB: 11/17/1946, 70 y.o.   MRN: [id number]  HPI  Pt in for back pain. Since last visit her husband has passed. He got seizures and then got pneumonia. Then complications from intubation. Then had a lot of complications. About march 5th husband passed. Pt is grieving and daughter has her on otc type mood medication.  Pt back pain started after pulling a plant. Pt states very large plant that got knocked over. When she tried to sit it up heard pop in her back. No radicular pain but points to lower lumbar and left si area for pain. Pt taking ibuprofen for pain.  No saddle anesthesia. No numbness to legs. No leg weakness. No incontinence.   Pt 3rd complaint early nasal congestion and faint early ha. Started this am. No associated neurologic signs or symptoms.   Review of Systems  Constitutional: Negative for fever, chills and fatigue.  HENT: Positive for congestion. Negative for drooling, ear pain, hearing loss, postnasal drip, sinus pressure, sneezing, tinnitus and trouble swallowing.   Respiratory: Negative for cough, chest tightness, shortness of breath and wheezing.   Cardiovascular: Negative for chest pain and palpitations.  Gastrointestinal: Negative for abdominal pain.  Genitourinary: Negative for dysuria, flank pain and genital sores.  Musculoskeletal: Positive for back pain. Negative for myalgias, gait problem and neck pain.  Skin: Negative for rash.  Neurological: Positive for headaches. Negative for dizziness.       Faint early ha along with nasal congestion.  Hematological: Negative for adenopathy. Does not bruise/bleed easily.  Psychiatric/Behavioral: Negative for hallucinations, behavioral problems and confusion. The patient is nervous/anxious.    Past Medical History  Diagnosis Date  . Hypertension     hx of borderline bp in past. Never was on meds.    Social History   Social History  . Marital Status: Married      Spouse Name: N/A  . Number of Children: N/A  . Years of Education: N/A   Occupational History  . Not on file.   Social History Main Topics  . Smoking status: Never Smoker   . Smokeless tobacco: Never Used  . Alcohol Use: No  . Drug Use: No  . Sexual Activity: Not on file   Other Topics Concern  . Not on file   Social History Narrative    Past Surgical History  Procedure Laterality Date  . Knee surgery      2 knee transplants    Family History  Problem Relation Age of Onset  . Heart disease Mother   . Heart disease Father     No Known Allergies  Current Outpatient Prescriptions on File Prior to Visit  Medication Sig Dispense Refill  . alendronate (FOSAMAX) 10 MG tablet Take 1 tablet (10 mg total) by mouth daily before breakfast. Take with a full glass of water on an empty stomach. 30 tablet 11  . atorvastatin (LIPITOR) 20 MG tablet Take 1 tablet (20 mg total) by mouth daily. 30 tablet 3  . hydrochlorothiazide (MICROZIDE) 12.5 MG capsule Take 1 capsule (12.5 mg total) by mouth daily. 30 capsule 0  . lisinopril (PRINIVIL,ZESTRIL) 20 MG tablet Take 1 tablet (20 mg total) by mouth daily. 90 tablet 0   No current facility-administered medications on file prior to visit.    BP 126/78 mmHg  Pulse 91  Temp(Src) 99 F (37.2 C) (Oral)  Ht 5' 2.5" (1.588 m)  Wt 153 lb 3.2  oz (69.491 kg)  BMI 27.56 kg/m2  SpO2 97%       Objective:   Physical Exam  General Appearance- Not in acute distress. Teary over husband death. Marland Kitchen HEENT Head- Normal. Ear Auditory Canal - Left- Normal. Right - Normal.Tympanic Membrane- Left- Normal. Right- Normal. Eye Sclera/Conjunctiva- Left- Normal. Right- Normal. Nose & Sinuses Nasal Mucosa- Left-  Boggy and Congested. Right-  Boggy and  Congested.Bilateral no  maxillary and  No frontal sinus pressure. Mouth & Throat Lips: Upper Lip- Normal: no dryness, cracking, pallor, cyanosis, or vesicular eruption. Lower Lip-Normal: no dryness,  cracking, pallor, cyanosis or vesicular eruption. Buccal Mucosa- Bilateral- No Aphthous ulcers. Oropharynx- No Discharge or Erythema. Tonsils: Characteristics- Bilateral- No Erythema or Congestion. Size/Enlargement- Bilateral- No enlargement. Discharge- bilateral-None.  Neck Neck- Supple. No Masses.   Chest and Lung Exam Auscultation: Breath sounds:-Normal. Clear even and unlabored. Adventitious sounds:- No Adventitious sounds.  Cardiovascular Auscultation:Rythm - Regular, rate and rythm. Heart Sounds -Normal heart sounds.  Abdomen Inspection:-Inspection Normal.  Palpation/Perucssion: Palpation and Percussion of the abdomen reveal- Non Tender, No Rebound tenderness, No rigidity(Guarding) and No Palpable abdominal masses.  Liver:-Normal.  Spleen:- Normal.   Back Mid lumbar spine tenderness to palpation. Lt si area tender. Pain on straight leg lift. Pain on lateral movements and flexion/extension of the spine.  Lower ext neurologic  L5-S1 sensation intact bilaterally. Normal patellar reflexes bilaterally. No foot drop bilaterally.      Assessment & Plan:  For back pain and sciatica will rx diclofenac. Stop ibuprofen. Rx tramadol. Use sparingly. Not to use when working or driving. Get lumbar xray.(rx advisement on using 1 diclofenac in am. And when getting home from work if pain persists then [place] tramadol)  For anxiety related to grieving rx clonopin.(use sparingly). Stop the med that your daughter gave you. This is very important. Explained reasoning.  For possible allergies, rx flonase. If symptoms worsen indicating sinus infection then notify us.  Follow up in 7 days or as needed.

## 2016-02-29 NOTE — Patient Instructions (Addendum)
For back pain and sciatica will rx diclofenac. Stop ibuprofen. Rx tramadol. Use sparingly. Not to use when working or driving. Get lumbar xray.(rx advisement on using 1 diclofenac in am. And when getting home from work if pain persists then us tramadol)  For anxiety related to grieving rx clonopin.(use sparingly). Stop the med that your daughter gave you. This is very important. Explained reasoning.  For possible allergies, rx flonase. If symptoms worsen indicating sinus infection then notify us.  Follow up in 7 days or as needed.

## 2016-02-29 NOTE — Telephone Encounter (Signed)
Referral to ortho made

## 2016-03-01 NOTE — Telephone Encounter (Signed)
Pt called and was notified of xray result. Pt hesitant to pursue ortho referral as she states she is "all alone and cannot be put in the hospital".  I advised her this would just be a consultation to discuss potential treatment options and if she wants to proceed with referral to call the orthopedic office back. Pt voices understanding. Pt scheduled lab appt for 03/08/16 at 7am, future order entered.

## 2016-03-08 ENCOUNTER — Other Ambulatory Visit (INDEPENDENT_AMBULATORY_CARE_PROVIDER_SITE_OTHER): Payer: Federal, State, Local not specified - PPO

## 2016-03-08 DIAGNOSIS — S22080A Wedge compression fracture of T11-T12 vertebra, initial encounter for closed fracture: Secondary | ICD-10-CM

## 2016-03-08 DIAGNOSIS — S22070A Wedge compression fracture of T9-T10 vertebra, initial encounter for closed fracture: Secondary | ICD-10-CM | POA: Diagnosis not present

## 2016-03-08 DIAGNOSIS — Z8781 Personal history of (healed) traumatic fracture: Secondary | ICD-10-CM | POA: Diagnosis not present

## 2016-03-08 DIAGNOSIS — M1288 Other specific arthropathies, not elsewhere classified, other specified site: Secondary | ICD-10-CM | POA: Diagnosis not present

## 2016-03-08 DIAGNOSIS — IMO0002 Reserved for concepts with insufficient information to code with codable children: Secondary | ICD-10-CM

## 2016-03-08 LAB — VITAMIN D 25 HYDROXY (VIT D DEFICIENCY, FRACTURES): VITD: 34.55 ng/mL (ref 30.00–100.00)

## 2016-04-30 ENCOUNTER — Telehealth: Payer: Self-pay | Admitting: Medical

## 2016-04-30 ENCOUNTER — Other Ambulatory Visit: Payer: Self-pay | Admitting: Medical

## 2016-04-30 MED ORDER — LISINOPRIL 20 MG PO TABS: 20.0000 mg | ORAL_TABLET | Freq: Every day | ORAL | Status: DC

## 2016-04-30 NOTE — Telephone Encounter (Signed)
Rx sent to pharmacy and pt will need to follow up before further refills will be given. Please have pt come in for a follow up in 3 months.

## 2016-04-30 NOTE — Telephone Encounter (Signed)
Relationship to patient: self Can be reached: [phone] Pharmacy: [id number] - [place], [place] - [address]  Reason for call: Pt needing refill on lisinopril. Takes 1/day. Has 2 left. Please send in for her. Last OV 02/27/16.

## 2016-05-01 NOTE — Telephone Encounter (Signed)
LM to notify pt and to call and schedule f/u within 3 months.

## 2016-07-27 ENCOUNTER — Other Ambulatory Visit: Payer: Self-pay | Admitting: Medical

## 2016-10-22 DIAGNOSIS — K080 Exfoliation of teeth due to systemic causes: Secondary | ICD-10-CM | POA: Diagnosis not present

## 2016-10-30 ENCOUNTER — Telehealth: Payer: Self-pay | Admitting: Medical

## 2016-10-30 ENCOUNTER — Ambulatory Visit (INDEPENDENT_AMBULATORY_CARE_PROVIDER_SITE_OTHER): Payer: Federal, State, Local not specified - PPO | Admitting: Medical

## 2016-10-30 ENCOUNTER — Encounter: Payer: Self-pay | Admitting: Medical

## 2016-10-30 VITALS — BP 124/82 | HR 78 | Temp 98.1°F | Ht 62.5 in | Wt 153.2 lb

## 2016-10-30 DIAGNOSIS — J069 Acute upper respiratory infection, unspecified: Secondary | ICD-10-CM

## 2016-10-30 DIAGNOSIS — E785 Hyperlipidemia, unspecified: Secondary | ICD-10-CM

## 2016-10-30 DIAGNOSIS — M5432 Sciatica, left side: Secondary | ICD-10-CM

## 2016-10-30 DIAGNOSIS — F32A Depression, unspecified: Secondary | ICD-10-CM

## 2016-10-30 DIAGNOSIS — I10 Essential (primary) hypertension: Secondary | ICD-10-CM | POA: Diagnosis not present

## 2016-10-30 DIAGNOSIS — F329 Major depressive disorder, single episode, unspecified: Secondary | ICD-10-CM

## 2016-10-30 LAB — LIPID PANEL
CHOLESTEROL: 190 mg/dL (ref 0–200)
HDL: 56.3 mg/dL (ref >39.00)
LDL Cholesterol: 117 mg/dL — ABNORMAL HIGH (ref 0–99)
NonHDL: 133.44
Total CHOL/HDL Ratio: 3
Triglycerides: 82 mg/dL (ref 0.0–149.0)
VLDL: 16.4 mg/dL (ref 0.0–40.0)

## 2016-10-30 LAB — COMPREHENSIVE METABOLIC PANEL
ALBUMIN: 3.9 g/dL (ref 3.5–5.2)
ALK PHOS: 86 U/L (ref 39–117)
ALT: 13 U/L (ref 0–35)
AST: 16 U/L (ref 0–37)
BILIRUBIN TOTAL: 0.3 mg/dL (ref 0.2–1.2)
BUN: 21 mg/dL (ref 6–23)
CO2: 22 mEq/L (ref 19–32)
Calcium: 9.2 mg/dL (ref 8.4–10.5)
Chloride: 109 mEq/L (ref 96–112)
Creatinine, Ser: 0.98 mg/dL (ref 0.40–1.20)
GFR: 59.78 mL/min — AB (ref >60.00)
Glucose, Bld: 99 mg/dL (ref 70–99)
POTASSIUM: 4.3 meq/L (ref 3.5–5.1)
SODIUM: 139 meq/L (ref 135–145)
TOTAL PROTEIN: 6.8 g/dL (ref 6.0–8.3)

## 2016-10-30 MED ORDER — BENZONATATE 100 MG PO CAPS: 100.0000 mg | ORAL_CAPSULE | Freq: Three times a day (TID) | ORAL | 0 refills | Status: DC | PRN

## 2016-10-30 MED ORDER — FLUTICASONE PROPIONATE 50 MCG/ACT NA SUSP: 2.0000 | Freq: Every day | NASAL | 0 refills | Status: DC

## 2016-10-30 MED ORDER — ATORVASTATIN CALCIUM 20 MG PO TABS: 20.0000 mg | ORAL_TABLET | Freq: Every day | ORAL | 1 refills | Status: DC

## 2016-10-30 MED ORDER — SERTRALINE HCL 25 MG PO TABS: 25.0000 mg | ORAL_TABLET | Freq: Every day | ORAL | 3 refills | Status: DC

## 2016-10-30 MED ORDER — DICLOFENAC SODIUM 75 MG PO TBEC: 75.0000 mg | DELAYED_RELEASE_TABLET | Freq: Two times a day (BID) | ORAL | 0 refills | Status: DC

## 2016-10-30 MED ORDER — AZITHROMYCIN 250 MG PO TABS
ORAL_TABLET | ORAL | 0 refills | Status: DC
Start: 2016-10-30 — End: 2017-03-07

## 2016-10-30 NOTE — Patient Instructions (Addendum)
For your htn and high cholesterol will repeat cmp and lipid panel today. Continue your lisinopril and will likely have to refill your lipitor.  For recent uri will rx flonase nasal spray for congestion and benzonatate for cough. If your sinus pressure worsens or chest congestion then start azithromycin.   For sciatica type pain refill your diclofenac.  For your depressed mood and anxiety rx sertraline low dose to use. Think about and start if you decide.  Follow up in 3 months or as needed.

## 2016-10-30 NOTE — Telephone Encounter (Signed)
rx lipitor sent in.

## 2016-10-30 NOTE — Progress Notes (Addendum)
Subjective:    Patient ID: Kayla FlockLinda B Clark, female    DOB: 01/24/1946, 70 y.o.   MRN: [id number]  HPI   Pt in for follow up.  Pt is continuing to work. She states she is paying of some debt that her husband incurred before he passed away. Pt has hopes to retire in 3 months.  Pt blood pressure is well controlled. No cardiac or neurologic signs or symptoms. Pt lipid panel was high on last check. She rarely comes in due to very busy work schedule. Pt is still on lisinopril but he has not been taking lipitor. When lipitor ran out she did not ask for refill.  Pt expresses some transient episodes sadness related to husband passing. She does describe some depression. She declines medication but she is attending grief counseling.  Pt nasal congested for one day. Mild cough since last night. No body aches, no fevers, no chills or sweats. Sneezing.  Pt does not some intermittent lt si area pain. No mid lspine pain. No radicular pain. No weakness in legs.     Review of Systems  Constitutional: Negative for chills, fatigue and fever.  HENT: Positive for congestion, sinus pain and sinus pressure. Negative for sneezing, tinnitus, trouble swallowing and voice change.   Respiratory: Negative for cough, choking, chest tightness, shortness of breath and wheezing.   Cardiovascular: Negative for chest pain and palpitations.  Gastrointestinal: Negative for abdominal pain.  Musculoskeletal: Negative for back pain.  Neurological: Negative for dizziness and headaches.  Hematological: Negative for adenopathy. Does not bruise/bleed easily.  Psychiatric/Behavioral: Positive for dysphoric mood. Negative for behavioral problems, decreased concentration, sleep disturbance and suicidal ideas. The patient is not nervous/anxious.     Past Medical History:  Diagnosis Date  . Hypertension    hx of borderline bp in past. Never was on meds.     Social History   Social History  . Marital status: Widowed   Spouse name: N/A  . Number of children: N/A  . Years of education: N/A   Occupational History  . Not on file.   Social History Main Topics  . Smoking status: Never Smoker  . Smokeless tobacco: Never Used  . Alcohol use No  . Drug use: No  . Sexual activity: Not on file   Other Topics Concern  . Not on file   Social History Narrative  . No narrative on file    Past Surgical History:  Procedure Laterality Date  . KNEE SURGERY     2 knee transplants    Family History  Problem Relation Age of Onset  . Heart disease Mother   . Heart disease Father     No Known Allergies  Current Outpatient Prescriptions on File Prior to Visit  Medication Sig Dispense Refill  . alendronate (FOSAMAX) 10 MG tablet Take 1 tablet (10 mg total) by mouth daily before breakfast. Take with a full glass of water on an empty stomach. 30 tablet 11  . atorvastatin (LIPITOR) 20 MG tablet Take 1 tablet (20 mg total) by mouth daily. 30 tablet 3  . clonazePAM (KLONOPIN) 0.5 MG tablet Take 1 tablet (0.5 mg total) by mouth at bedtime. 15 tablet 0  . diclofenac (VOLTAREN) 75 MG EC tablet Take 1 tablet (75 mg total) by mouth 2 (two) times daily. 30 tablet 0  . fluticasone (FLONASE) 50 MCG/ACT nasal spray Place 2 sprays into both nostrils daily. 16 g 0  . hydrochlorothiazide (MICROZIDE) 12.5 MG capsule Take 1 capsule (12.5  mg total) by mouth daily. 30 capsule 0  . lisinopril (PRINIVIL,ZESTRIL) 20 MG tablet TAKE 1 TABLET BY MOUTH EVERY DAY 90 tablet 0  . lisinopril (PRINIVIL,ZESTRIL) 20 MG tablet TAKE 1 TABLET (20 MG TOTAL) BY MOUTH DAILY. 90 tablet 0  . traMADol (ULTRAM) 50 MG tablet Take 1 tablet (50 mg total) by mouth every 8 (eight) hours as needed. 30 tablet 0   No current facility-administered medications on file prior to visit.     BP 124/82 (BP Location: Left Arm, Patient Position: Sitting, Cuff Size: Normal)   Pulse 78   Temp 98.1 F (36.7 C) (Oral)   Ht 5' 2.5" (1.588 m)   Wt 153 lb 3.2 oz (69.5  kg)   SpO2 98%   BMI 27.57 kg/m       Objective:   Physical Exam  General  Mental Status - Alert. General Appearance - Well groomed. Not in acute distress.  Skin Rashes- No Rashes.  HEENT Head- Normal. Ear Auditory Canal - Left- Normal. Right - Normal.Tympanic Membrane- Left- Normal. Right- Normal. Eye Sclera/Conjunctiva- Left- Normal. Right- Normal. Nose & Sinuses Nasal Mucosa- Left-  Boggy and Congested. Right-  Boggy and  Congested.Bilateral maxillary and frontal sinus pressure. Mouth & Throat Lips: Upper Lip- Normal: no dryness, cracking, pallor, cyanosis, or vesicular eruption. Lower Lip-Normal: no dryness, cracking, pallor, cyanosis or vesicular eruption. Buccal Mucosa- Bilateral- No Aphthous ulcers. Oropharynx- No Discharge or Erythema. Tonsils: Characteristics- Bilateral- No Erythema or Congestion. Size/Enlargement- Bilateral- No enlargement. Discharge- bilateral-None.  Neck Neck- Supple. No Masses.   Chest and Lung Exam Auscultation: Breath Sounds:-Clear even and unlabored.  Cardiovascular Auscultation:Rythm- Regular, rate and rhythm. Murmurs & Other Heart Sounds:Ausculatation of the heart reveal- No Murmurs.  Lymphatic Head & Neck General Head & Neck Lymphatics: Bilateral: Description- No Localized lymphadenopathy.   Neurologic Cranial Nerve exam:- CN III-XII intact(No nystagmus), symmetric smile. Strength:- 5/5 equal and symmetric strength both upper and lower extremities.  Back- no mid lumbar pain. Lt SI are tenderness to palpation.      Assessment & Plan:  For your htn and high cholesterol will repeat cmp and lipid panel today. Continue your lisinopril and will likely have to refill your lipitor.  For recent uri will rx flonase nasal spray for congestion and benzonatate for cough. If your sinus pressure worsens or chest congestion then start azithromycin.   For sciatica type pain refill your diclofenac.  For your depressed mood and anxiety rx  sertraline low dose to use. Think about and start if you decide.  Follow up in 3 months or as needed.   I notified pt to only take diclofenac occasionally but not consecutively/daily due to decreased gfr.  Brenleigh Collet, Ramon DredgeEdward, PA-C

## 2016-10-30 NOTE — Progress Notes (Signed)
Pre visit review using our clinic review tool, if applicable. No additional management support is needed unless otherwise documented below in the visit note. 

## 2016-11-25 ENCOUNTER — Other Ambulatory Visit: Payer: Self-pay | Admitting: Medical

## 2016-12-18 ENCOUNTER — Other Ambulatory Visit: Payer: Self-pay | Admitting: Medical

## 2016-12-21 ENCOUNTER — Telehealth: Payer: Self-pay | Admitting: Medical

## 2016-12-21 NOTE — Telephone Encounter (Signed)
Rx has been sent today 12/21/16 to requested pharmacy prior to this note/SLS 02/02

## 2016-12-21 NOTE — Telephone Encounter (Signed)
[id number] [place], [place] - [address] [phone])    Pharmacy requesting a refill sertraline (ZOLOFT) 25 MG tablet

## 2016-12-30 ENCOUNTER — Other Ambulatory Visit: Payer: Self-pay | Admitting: Medical

## 2017-01-21 ENCOUNTER — Other Ambulatory Visit: Payer: Self-pay | Admitting: Medical

## 2017-01-23 NOTE — Telephone Encounter (Signed)
Refilled pt sertraline. Call pt and notify. Verify pt doing ok. Offer appointment if not doing well regarding her mood.

## 2017-01-26 ENCOUNTER — Other Ambulatory Visit: Payer: Self-pay | Admitting: Medical

## 2017-02-06 ENCOUNTER — Telehealth: Payer: Self-pay | Admitting: Medical

## 2017-02-06 MED ORDER — LISINOPRIL 20 MG PO TABS: 20.0000 mg | ORAL_TABLET | Freq: Every day | ORAL | 0 refills | Status: DC

## 2017-02-06 NOTE — Telephone Encounter (Signed)
°  Relation to IO:NGEXpt:self Call back number: [phone] [facility]   Reason for call: pt is needing rx lisinopril (PRINIVIL,ZESTRIL) 20 MG tablet, pt would like a call to confirm rx was called in

## 2017-02-06 NOTE — Telephone Encounter (Signed)
Patient notified that rx has been sent in.  She wanted to let us know that she is not taking the sertraline and want to remove it off her list.  Sertraline removed.

## 2017-02-26 NOTE — Telephone Encounter (Signed)
Called to follow up with patient. Pt states she never started the sertarline for fear that the medication would cause her to have suicidal thoughts (one of the possible side effects of the medication).  Pt states her mood has not changed much.  She continues to feel sad after the loss of her husband.  She also feels tired majority of the time and had one episode of blurry vision since last office visit.  Given patient's report and that AVS advised her to return in 3 months for follow up, scheduled patient an appt with Ramon Dredge for 03/07/17 at 6:15pm to accommodate patient's work schedule.  She knows to come in sooner if needed.    Message routed to [facility] for [place].

## 2017-03-07 ENCOUNTER — Encounter: Payer: Self-pay | Admitting: Medical

## 2017-03-07 ENCOUNTER — Ambulatory Visit (INDEPENDENT_AMBULATORY_CARE_PROVIDER_SITE_OTHER): Payer: Federal, State, Local not specified - PPO | Admitting: Medical

## 2017-03-07 VITALS — BP 139/90 | HR 69 | Temp 98.8°F | Resp 16 | Ht 63.0 in | Wt 155.0 lb

## 2017-03-07 DIAGNOSIS — F4320 Adjustment disorder, unspecified: Secondary | ICD-10-CM | POA: Diagnosis not present

## 2017-03-07 DIAGNOSIS — F329 Major depressive disorder, single episode, unspecified: Secondary | ICD-10-CM | POA: Diagnosis not present

## 2017-03-07 DIAGNOSIS — F32A Depression, unspecified: Secondary | ICD-10-CM

## 2017-03-07 DIAGNOSIS — F4321 Adjustment disorder with depressed mood: Secondary | ICD-10-CM

## 2017-03-07 DIAGNOSIS — I10 Essential (primary) hypertension: Secondary | ICD-10-CM | POA: Diagnosis not present

## 2017-03-07 NOTE — Patient Instructions (Addendum)
For your mood/depression you could try sertraline. Up date me in 2 weeks how you feel and if some improvement but not ideal could increase dose to 50 mg.  Keep attending church. I think this will help with your mood.  Your bp medication is good today. Continue same med.  Follow up to be to be determined based on response to sertraline treatment. 3 months if doing well. 1 month if not do well with sertraline.  Follow during interim if needed as well.  If any side effects with sertraline. Any thoughts harm to self or others stop med and let us know.

## 2017-03-07 NOTE — Progress Notes (Signed)
Pre visit review using our clinic review tool, if applicable. No additional management support is needed unless otherwise documented below in the visit note. 

## 2017-03-07 NOTE — Progress Notes (Signed)
Subjective:    Patient ID: Kayla Clark, female    DOB: 01-Aug-1946, 71 y.o.   MRN: [id number]  HPI   Pt in for follow up she is still having depression.   She is sad about her husband death. She is lonely. She is fatigued and not sleeping well. Pt states she is trying to decide when to retire.  No homicidal or suicidal ideations. She works 10 hours a day. 5 days a week.  Pt not eating much.   Pt did not want to use sertraline initially. She saw on insert might cause depression or suicidal ideations. But after discussion she states she will try.   Review of Systems  Constitutional: Negative for chills, fatigue and fever.  Respiratory: Negative for cough, choking, shortness of breath and wheezing.   Cardiovascular: Negative for chest pain and palpitations.  Gastrointestinal: Negative for abdominal pain.  Genitourinary: Negative for dysuria.  Musculoskeletal: Negative for back pain and neck pain.  Skin: Negative for rash.  Neurological: Negative for dizziness, weakness and headaches.  Hematological: Negative for adenopathy. Does not bruise/bleed easily.  Psychiatric/Behavioral: Positive for dysphoric mood. Negative for behavioral problems, confusion, decreased concentration, sleep disturbance and suicidal ideas. The patient is not nervous/anxious.     Past Medical History:  Diagnosis Date  . Hypertension    hx of borderline bp in past. Never was on meds.     Social History   Social History  . Marital status: Widowed    Spouse name: N/A  . Number of children: N/A  . Years of education: N/A   Occupational History  . Not on file.   Social History Main Topics  . Smoking status: Never Smoker  . Smokeless tobacco: Never Used  . Alcohol use No  . Drug use: No  . Sexual activity: Not on file   Other Topics Concern  . Not on file   Social History Narrative  . No narrative on file    Past Surgical History:  Procedure Laterality Date  . KNEE SURGERY     2  knee transplants    Family History  Problem Relation Age of Onset  . Heart disease Mother   . Heart disease Father     No Known Allergies  Current Outpatient Prescriptions on File Prior to Visit  Medication Sig Dispense Refill  . atorvastatin (LIPITOR) 20 MG tablet Take 1 tablet (20 mg total) by mouth daily. 90 tablet 1  . diclofenac (VOLTAREN) 75 MG EC tablet TAKE 1 TABLET TWICE A DAY 30 tablet 3  . fluticasone (FLONASE) 50 MCG/ACT nasal spray Place 2 sprays into both nostrils daily. 16 g 0  . lisinopril (PRINIVIL,ZESTRIL) 20 MG tablet Take 1 tablet (20 mg total) by mouth daily. 90 tablet 0  . alendronate (FOSAMAX) 10 MG tablet Take 1 tablet (10 mg total) by mouth daily before breakfast. Take with a full glass of water on an empty stomach. (Patient not taking: Reported on 03/07/2017) 30 tablet 11  . hydrochlorothiazide (MICROZIDE) 12.5 MG capsule Take 1 capsule (12.5 mg total) by mouth daily. (Patient not taking: Reported on 03/07/2017) 30 capsule 0   No current facility-administered medications on file prior to visit.     BP 139/90 (BP Location: Left Arm, Patient Position: Sitting, Cuff Size: Normal)   Pulse 69   Temp 98.8 F (37.1 C) (Oral)   Resp 16   Ht  (1.6 m)   Wt 155 lb (70.3 kg)   SpO2 99%  BMI 27.46 kg/m       Objective:   Physical Exam   General Mental Status- Alert. General Appearance- Not in acute distress.     Chest and Lung Exam Auscultation: Breath Sounds:-Normal.  Cardiovascular Auscultation:Rythm- Regular. Murmurs & Other Heart Sounds:Auscultation of the heart reveals- No Murmurs.   Neurologic Cranial Nerve exam:- CN III-XII intact(No nystagmus), symmetric smile. Strength:- 5/5 equal and symmetric strength both upper and lower extremities.     Assessment & Plan:  For your mood/depression you could try sertraline. Up date me in 2 weeks how you feel and if some improvement but not ideal could increase dose to 50 mg.  Keep attending  church. I think this will help with your mood.  Your bp medication is good today. Continue same med.  Follow up to be to be determined based on response to sertraline treatment. 3 months if doing well. 1 month if not do well with sertraline.  Follow during interim if needed as well.   If any side effects with sertraline. Any thoughts harm to self or others stop med and let us know.  Kieu Quiggle, Ramon Dredge, PA-C

## 2017-03-28 ENCOUNTER — Other Ambulatory Visit: Payer: Self-pay | Admitting: Medical

## 2017-04-22 ENCOUNTER — Other Ambulatory Visit: Payer: Self-pay | Admitting: Medical

## 2017-05-13 ENCOUNTER — Other Ambulatory Visit: Payer: Self-pay | Admitting: Medical

## 2017-05-15 ENCOUNTER — Telehealth: Payer: Self-pay | Admitting: Medical

## 2017-05-15 NOTE — Telephone Encounter (Signed)
Rx sent to pharmacy   

## 2017-05-15 NOTE — Telephone Encounter (Signed)
Patient checking on status.

## 2017-05-15 NOTE — Telephone Encounter (Signed)
Will you notify pt on status of her lisinopril refill.

## 2017-06-13 DIAGNOSIS — K080 Exfoliation of teeth due to systemic causes: Secondary | ICD-10-CM | POA: Diagnosis not present

## 2017-06-20 ENCOUNTER — Telehealth: Payer: Self-pay | Admitting: Medical

## 2017-06-20 NOTE — Telephone Encounter (Signed)
Called pt, made her aware. Pt says that it is storming so she will call us back to schedule apt.

## 2017-06-20 NOTE — Telephone Encounter (Signed)
Pt is due for follow up please call and schedule appointment.  

## 2017-06-21 ENCOUNTER — Other Ambulatory Visit: Payer: Self-pay | Admitting: Medical

## 2017-06-27 DIAGNOSIS — K080 Exfoliation of teeth due to systemic causes: Secondary | ICD-10-CM | POA: Diagnosis not present

## 2017-07-19 ENCOUNTER — Other Ambulatory Visit: Payer: Self-pay | Admitting: Medical

## 2017-07-19 NOTE — Telephone Encounter (Signed)
Pt due for follow up please call and schedule appointment.  

## 2017-08-14 ENCOUNTER — Other Ambulatory Visit: Payer: Self-pay | Admitting: Medical

## 2017-08-14 NOTE — Telephone Encounter (Signed)
At 4.19.18 OV advised to F/U in 1 month/pharm to advise pt to sched appt first/thx dmf

## 2017-08-15 ENCOUNTER — Telehealth: Payer: Self-pay | Admitting: Medical

## 2017-08-15 NOTE — Telephone Encounter (Signed)
lvm for pt to return call for an apt. °

## 2017-08-15 NOTE — Telephone Encounter (Signed)
Pt due for follow up please call and schedule.  

## 2017-08-19 ENCOUNTER — Other Ambulatory Visit: Payer: Self-pay | Admitting: Medical

## 2017-08-20 ENCOUNTER — Encounter: Payer: Self-pay | Admitting: Medical

## 2017-08-20 ENCOUNTER — Ambulatory Visit (INDEPENDENT_AMBULATORY_CARE_PROVIDER_SITE_OTHER): Payer: Federal, State, Local not specified - PPO | Admitting: Medical

## 2017-08-20 VITALS — BP 140/70 | HR 33 | Temp 98.3°F | Resp 14 | Ht 63.0 in | Wt 162.4 lb

## 2017-08-20 DIAGNOSIS — R5383 Other fatigue: Secondary | ICD-10-CM | POA: Diagnosis not present

## 2017-08-20 DIAGNOSIS — E785 Hyperlipidemia, unspecified: Secondary | ICD-10-CM

## 2017-08-20 DIAGNOSIS — F329 Major depressive disorder, single episode, unspecified: Secondary | ICD-10-CM | POA: Diagnosis not present

## 2017-08-20 DIAGNOSIS — I10 Essential (primary) hypertension: Secondary | ICD-10-CM | POA: Diagnosis not present

## 2017-08-20 DIAGNOSIS — Z23 Encounter for immunization: Secondary | ICD-10-CM

## 2017-08-20 DIAGNOSIS — F32A Depression, unspecified: Secondary | ICD-10-CM

## 2017-08-20 LAB — CBC WITH DIFFERENTIAL/PLATELET
BASOS ABS: 0 10*3/uL (ref 0.0–0.1)
Basophils Relative: 0.5 % (ref 0.0–3.0)
EOS PCT: 1.2 % (ref 0.0–5.0)
Eosinophils Absolute: 0.1 10*3/uL (ref 0.0–0.7)
HCT: 36.7 % (ref 36.0–46.0)
HEMOGLOBIN: 12 g/dL (ref 12.0–15.0)
LYMPHS ABS: 1.9 10*3/uL (ref 0.7–4.0)
Lymphocytes Relative: 23.7 % (ref 12.0–46.0)
MCHC: 32.8 g/dL (ref 30.0–36.0)
MCV: 92.5 fl (ref 78.0–100.0)
MONO ABS: 0.7 10*3/uL (ref 0.1–1.0)
Monocytes Relative: 8.3 % (ref 3.0–12.0)
NEUTROS PCT: 66.3 % (ref 43.0–77.0)
Neutro Abs: 5.4 10*3/uL (ref 1.4–7.7)
Platelets: 247 10*3/uL (ref 150.0–400.0)
RBC: 3.97 Mil/uL (ref 3.87–5.11)
RDW: 13.6 % (ref 11.5–15.5)
WBC: 8.1 10*3/uL (ref 4.0–10.5)

## 2017-08-20 LAB — COMPREHENSIVE METABOLIC PANEL
ALBUMIN: 3.9 g/dL (ref 3.5–5.2)
ALK PHOS: 81 U/L (ref 39–117)
ALT: 12 U/L (ref 0–35)
AST: 13 U/L (ref 0–37)
BILIRUBIN TOTAL: 0.5 mg/dL (ref 0.2–1.2)
BUN: 24 mg/dL — AB (ref 6–23)
CO2: 27 mEq/L (ref 19–32)
Calcium: 9.2 mg/dL (ref 8.4–10.5)
Chloride: 107 mEq/L (ref 96–112)
Creatinine, Ser: 0.99 mg/dL (ref 0.40–1.20)
GFR: 58.94 mL/min — ABNORMAL LOW (ref >60.00)
GLUCOSE: 94 mg/dL (ref 70–99)
POTASSIUM: 4.5 meq/L (ref 3.5–5.1)
SODIUM: 140 meq/L (ref 135–145)
TOTAL PROTEIN: 6.5 g/dL (ref 6.0–8.3)

## 2017-08-20 LAB — LIPID PANEL
Cholesterol: 150 mg/dL (ref 0–200)
HDL: 52.9 mg/dL (ref >39.00)
LDL Cholesterol: 83 mg/dL (ref 0–99)
NONHDL: 96.96
Total CHOL/HDL Ratio: 3
Triglycerides: 70 mg/dL (ref 0.0–149.0)
VLDL: 14 mg/dL (ref 0.0–40.0)

## 2017-08-20 LAB — TSH: TSH: 1.5 u[IU]/mL (ref 0.35–4.50)

## 2017-08-20 LAB — VITAMIN B12: Vitamin B-12: 184 pg/mL — ABNORMAL LOW (ref 211–911)

## 2017-08-20 MED ORDER — LISINOPRIL 20 MG PO TABS: 20.0000 mg | ORAL_TABLET | Freq: Every day | ORAL | 1 refills | Status: DC

## 2017-08-20 MED ORDER — SERTRALINE HCL 50 MG PO TABS: 50.0000 mg | ORAL_TABLET | Freq: Every day | ORAL | 0 refills | Status: DC

## 2017-08-20 NOTE — Patient Instructions (Addendum)
For your depression and decreased mood I want you to increase sertraline to 50 mg a day. Update me on how you are doing in 2 weeks or sooner.  For high cholesterol check lipid panel today.   For high bp continue current med regimen but check bp at home and confirm less than 140/90.  For fatigue will get cbc, cmp, b12 and tsh level.  Follow up date in 3 months or as needed

## 2017-08-20 NOTE — Progress Notes (Signed)
Subjective:    Patient ID: Kayla Clark, female    DOB: July 25, 1946, 71 y.o.   MRN: [id number]  HPI  Pt in for follow up.  Pt has htn.She has not been checking her blood pressure. No cardiac or neurologic signs or symptoms..  Pt has some depression. Still some decreased mood since husband death. Pt is attending church and reading the bible. Pt is taking sertraline. No thoughts of harm to self or others. Pt never increased her sertraline to 50 mg as I recommended. But she is willing to increase this time  Pt has some fatigue. Mild but works 10 hour days 5 days a week.   Pt has high cholesterol as well.   Review of Systems  Constitutional: Negative for chills, fatigue and fever.  Respiratory: Negative for cough, chest tightness, shortness of breath and wheezing.   Cardiovascular: Negative for chest pain and palpitations.  Gastrointestinal: Negative for abdominal pain, diarrhea, nausea and vomiting.  Musculoskeletal: Negative for back pain and joint swelling.  Skin: Negative for rash.  Neurological: Negative for dizziness, syncope, weakness, numbness and headaches.  Hematological: Negative for adenopathy. Does not bruise/bleed easily.  Psychiatric/Behavioral: Positive for dysphoric mood. Negative for confusion, decreased concentration, sleep disturbance and suicidal ideas. The patient is not nervous/anxious.     Past Medical History:  Diagnosis Date  . Hypertension    hx of borderline bp in past. Never was on meds.     Social History   Social History  . Marital status: Widowed    Spouse name: N/A  . Number of children: N/A  . Years of education: N/A   Occupational History  . Not on file.   Social History Main Topics  . Smoking status: Never Smoker  . Smokeless tobacco: Never Used  . Alcohol use No  . Drug use: No  . Sexual activity: Not on file   Other Topics Concern  . Not on file   Social History Narrative  . No narrative on file    Past Surgical  History:  Procedure Laterality Date  . KNEE SURGERY     2 knee transplants    Family History  Problem Relation Age of Onset  . Heart disease Mother   . Heart disease Father     No Known Allergies  Current Outpatient Prescriptions on File Prior to Visit  Medication Sig Dispense Refill  . alendronate (FOSAMAX) 10 MG tablet Take 1 tablet (10 mg total) by mouth daily before breakfast. Take with a full glass of water on an empty stomach. 30 tablet 11  . atorvastatin (LIPITOR) 20 MG tablet TAKE 1 TABLET (20 MG TOTAL) BY MOUTH DAILY. 90 tablet 1  . diclofenac (VOLTAREN) 75 MG EC tablet TAKE 1 TABLET TWICE A DAY 30 tablet 3  . fluticasone (FLONASE) 50 MCG/ACT nasal spray Place 2 sprays into both nostrils daily. 16 g 0  . hydrochlorothiazide (MICROZIDE) 12.5 MG capsule Take 1 capsule (12.5 mg total) by mouth daily. 30 capsule 0  . lisinopril (PRINIVIL,ZESTRIL) 20 MG tablet TAKE 1 TABLET (20 MG TOTAL) BY MOUTH DAILY. 90 tablet 0  . sertraline (ZOLOFT) 25 MG tablet TAKE 1 TABLET EVERY DAY 30 tablet 0   No current facility-administered medications on file prior to visit.     BP (!) 146/71   Pulse (!) 33   Temp 98.3 F (36.8 C) (Oral)   Resp 14   Ht  (1.6 m)   Wt 162 lb 6.4 oz (73.7 kg)  SpO2 100%   BMI 28.77 kg/m       Objective:   Physical Exam  General Mental Status- Alert. General Appearance- Not in acute distress.   Skin General: Color- Normal Color. Moisture- Normal Moisture.  Neck Carotid Arteries- Normal color. Moisture- Normal Moisture. No carotid bruits. No JVD.  Chest and Lung Exam Auscultation: Breath Sounds:-Normal.  Cardiovascular Auscultation:Rythm- Regular. Murmurs & Other Heart Sounds:Auscultation of the heart reveals- No Murmurs.  Abdomen Inspection:-Inspeection Normal. Palpation/Percussion:Note:No mass. Palpation and Percussion of the abdomen reveal- Non Tender, Non Distended + BS, no rebound or guarding.    Neurologic Cranial Nerve  exam:- CN III-XII intact(No nystagmus), symmetric smile. Strength:- 5/5 equal and symmetric strength both upper and lower extremities.      Assessment & Plan:  For your depression and decreased mood I want you to increase sertraline to 50 mg a day. Update me on how you are doing in 2 weeks or sooner.  For high cholesterol check lipid panel today.   For high bp continue current med regimen but check bp at home and confirm less than 140/90.  For fatigue will get cbc, cmp, b12 and tsh level.  Follow up date in 3 months or as needed  Cornelio Parkerson, Ramon Dredge, [place]

## 2017-08-21 ENCOUNTER — Telehealth: Payer: Self-pay

## 2017-08-21 NOTE — Telephone Encounter (Signed)
Patient agrees to get B12 injections for 4 months and then have level redrawn/She states she will call back to schedule 1st injection for October/thx dmf

## 2017-08-21 NOTE — Telephone Encounter (Signed)
-----   Message from Esperanza Richters, PA-C sent at 08/20/2017  5:02 PM EDT ----- Kidney function is stable/mild low. Pt cholesterol level is well controlled/good today. Better lipids than last time. No anemia, normal infection fighting cells and normal thyroid. b12 level is low. This may be a factor in her fatigue. b12 184. Go over normal range with her. Schedule her for monthly b12 injections. Repeat b12 level in 4 months see where her levels are at that time.

## 2017-09-11 ENCOUNTER — Telehealth: Payer: Self-pay | Admitting: Medical

## 2017-09-12 ENCOUNTER — Telehealth: Payer: Self-pay | Admitting: Medical

## 2017-09-12 MED ORDER — CYANOCOBALAMIN 1000 MCG/ML IJ SOLN: 1000.0000 ug | INTRAMUSCULAR | 0 refills | Status: DC

## 2017-09-12 NOTE — Telephone Encounter (Signed)
Please advise 

## 2017-09-12 NOTE — Telephone Encounter (Signed)
I sent in the prescription of 10 ml b12 to pharmacy. Will be 1 ml injection per month. Will see if it is covered by her insurance. If not then advise monthly injection here. Either way repeat b12 level in 6 months.

## 2017-09-12 NOTE — Telephone Encounter (Addendum)
Relation to XB:MWUXpt:self Call back [phone] [id number] [place], [place] - [address] [phone])    Reason for call:   Patient requesting a 3 month supply diclofenac (VOLTAREN) 75 MG EC tablet, please advise  Patient would like B12 injection script faxed to pharmacy due to not being able to come in the office due to work conflict, please advise

## 2017-09-12 NOTE — Telephone Encounter (Signed)
Opened to review 

## 2017-09-12 NOTE — Telephone Encounter (Signed)
Will you call pt and let her know not sure that insurance will cover b-vitamin vials to inject at home. I need some time to talk with pharmacist. Verify she feel like can't come in once a month for nurse visit?

## 2017-09-13 NOTE — Telephone Encounter (Signed)
Left pt a message to call back. 

## 2017-09-13 NOTE — Telephone Encounter (Signed)
Notified pt. Pt will call and let us know is medication is not covered.

## 2017-09-13 NOTE — Telephone Encounter (Signed)
Notified pt. Pt will call and let us know is medication is not covered.  

## 2017-09-18 ENCOUNTER — Other Ambulatory Visit: Payer: Self-pay | Admitting: Medical

## 2017-10-22 ENCOUNTER — Other Ambulatory Visit: Payer: Self-pay | Admitting: Medical

## 2017-11-08 ENCOUNTER — Telehealth: Payer: Self-pay | Admitting: Medical

## 2017-11-08 MED ORDER — SERTRALINE HCL 50 MG PO TABS: 50.0000 mg | ORAL_TABLET | Freq: Every day | ORAL | 0 refills | Status: DC

## 2017-11-08 NOTE — Telephone Encounter (Signed)
RX sen to pharmacy 

## 2017-11-08 NOTE — Telephone Encounter (Signed)
Copied from CRM [id number]. Topic: Quick Communication - Rx Refill/Question >> Nov 08, 2017  2:32 PM Oneal GroutSebastian, Jennifer S wrote: Has the patient contacted their pharmacy? Yes.     (Agent: If no, request that the patient contact the pharmacy for the refill.)   Preferred Pharmacy (with phone number or street name): [facility]    Agent: Please be advised that RX refills may take up to 3 business days. We ask that you follow-up with your pharmacy. Requesting refill on sertraline (ZOLOFT) 50 MG tablet

## 2017-11-14 ENCOUNTER — Other Ambulatory Visit: Payer: Self-pay

## 2017-11-14 ENCOUNTER — Other Ambulatory Visit: Payer: Self-pay | Admitting: Medical

## 2017-11-14 MED ORDER — SERTRALINE HCL 50 MG PO TABS: 50.0000 mg | ORAL_TABLET | Freq: Every day | ORAL | 0 refills | Status: DC

## 2017-11-14 MED ORDER — SERTRALINE HCL 50 MG PO TABS: 50.0000 mg | ORAL_TABLET | Freq: Every day | ORAL | 3 refills | Status: DC

## 2017-11-14 NOTE — Telephone Encounter (Signed)
Copied from CRM [id number]. Topic: Quick Communication - See Telephone Encounter >> Nov 14, 2017 11:05 AM Diana EvesHoyt, Maryann B wrote: CRM for notification. See Telephone encounter for:  Pts pharmacy is stating they didn't receive sertraline (ZOLOFT) 50 MG tablet.        11/14/17.

## 2017-11-14 NOTE — Telephone Encounter (Signed)
Rx resent.

## 2017-12-11 ENCOUNTER — Other Ambulatory Visit: Payer: Self-pay | Admitting: Medical

## 2017-12-16 DIAGNOSIS — K080 Exfoliation of teeth due to systemic causes: Secondary | ICD-10-CM | POA: Diagnosis not present

## 2018-02-07 ENCOUNTER — Other Ambulatory Visit: Payer: Self-pay | Admitting: Medical

## 2018-02-12 ENCOUNTER — Encounter: Payer: Self-pay | Admitting: Medical

## 2018-02-12 ENCOUNTER — Ambulatory Visit: Payer: Federal, State, Local not specified - PPO | Admitting: Medical

## 2018-02-12 VITALS — BP 126/80 | HR 68 | Temp 98.5°F | Resp 16 | Ht 63.0 in | Wt 160.2 lb

## 2018-02-12 DIAGNOSIS — F329 Major depressive disorder, single episode, unspecified: Secondary | ICD-10-CM | POA: Diagnosis not present

## 2018-02-12 DIAGNOSIS — E785 Hyperlipidemia, unspecified: Secondary | ICD-10-CM | POA: Diagnosis not present

## 2018-02-12 DIAGNOSIS — Z23 Encounter for immunization: Secondary | ICD-10-CM

## 2018-02-12 DIAGNOSIS — F32A Depression, unspecified: Secondary | ICD-10-CM

## 2018-02-12 DIAGNOSIS — I10 Essential (primary) hypertension: Secondary | ICD-10-CM

## 2018-02-12 LAB — LIPID PANEL
CHOL/HDL RATIO: 3
Cholesterol: 160 mg/dL (ref 0–200)
HDL: 54.5 mg/dL (ref >39.00)
LDL Cholesterol: 88 mg/dL (ref 0–99)
NONHDL: 105.97
Triglycerides: 88 mg/dL (ref 0.0–149.0)
VLDL: 17.6 mg/dL (ref 0.0–40.0)

## 2018-02-12 LAB — COMPREHENSIVE METABOLIC PANEL
ALT: 12 U/L (ref 0–35)
AST: 14 U/L (ref 0–37)
Albumin: 4 g/dL (ref 3.5–5.2)
Alkaline Phosphatase: 97 U/L (ref 39–117)
BILIRUBIN TOTAL: 0.5 mg/dL (ref 0.2–1.2)
BUN: 31 mg/dL — AB (ref 6–23)
CO2: 26 mEq/L (ref 19–32)
Calcium: 9.8 mg/dL (ref 8.4–10.5)
Chloride: 107 mEq/L (ref 96–112)
Creatinine, Ser: 1.02 mg/dL (ref 0.40–1.20)
GFR: 56.87 mL/min — ABNORMAL LOW (ref >60.00)
GLUCOSE: 108 mg/dL — AB (ref 70–99)
Potassium: 5.6 mEq/L — ABNORMAL HIGH (ref 3.5–5.1)
SODIUM: 140 meq/L (ref 135–145)
Total Protein: 7.4 g/dL (ref 6.0–8.3)

## 2018-02-12 MED ORDER — LISINOPRIL 20 MG PO TABS: 20.0000 mg | ORAL_TABLET | Freq: Every day | ORAL | 1 refills | Status: DC

## 2018-02-12 MED ORDER — SERTRALINE HCL 50 MG PO TABS: 50.0000 mg | ORAL_TABLET | Freq: Every day | ORAL | 1 refills | Status: DC

## 2018-02-12 NOTE — Patient Instructions (Addendum)
Bp is well controlled continue current bp meds. Refill lisnopril today. Was on hctz in past but in computer not on for 2 years.  Mood well controlled. Continue sertraline.  For high cholesterol please get cmp and lipid panel.  Follow up date to be determined after lab review.

## 2018-02-12 NOTE — Progress Notes (Signed)
Subjective:    Patient ID: Kayla Clark, female    DOB: 08/02/1946, 72 y.o.   MRN: [id number]  HPI  Pt bp is well controlled. No cardiac or neurologic signs or symptoms. When patient checked her bp have been simlilar to 130/80.  Pt has high cholesterol. She is fasting. On lipitor.  Pt states no allergies yet this year. Pt has used flonase available to use if needs.  Pt mood controlled/not reporting depression. She is on sertraline.   Review of Systems  Constitutional: Negative for chills, fatigue and fever.  HENT: Negative for congestion, ear pain, postnasal drip, rhinorrhea, sinus pressure, sinus pain and sneezing.   Respiratory: Negative for cough, chest tightness, shortness of breath and wheezing.   Cardiovascular: Negative for chest pain and palpitations.  Gastrointestinal: Negative for abdominal pain, constipation, diarrhea, nausea and vomiting.  Musculoskeletal: Negative for arthralgias, back pain and joint swelling.  Skin: Negative for rash.  Neurological: Negative for dizziness, seizures, speech difficulty and headaches.  Hematological: Negative for adenopathy. Does not bruise/bleed easily.  Psychiatric/Behavioral: Negative for behavioral problems and confusion. The patient is not nervous/anxious.     Past Medical History:  Diagnosis Date  . Hypertension    hx of borderline bp in past. Never was on meds.     Social History   Socioeconomic History  . Marital status: Widowed    Spouse name: Not on file  . Number of children: Not on file  . Years of education: Not on file  . Highest education level: Not on file  Occupational History  . Not on file  Social Needs  . Financial resource strain: Not on file  . Food insecurity:    Worry: Not on file    Inability: Not on file  . Transportation needs:    Medical: Not on file    Non-medical: Not on file  Tobacco Use  . Smoking status: Never Smoker  . Smokeless tobacco: Never Used  Substance and Sexual Activity   . Alcohol use: No    Alcohol/week: 0.0 oz  . Drug use: No  . Sexual activity: Not on file  Lifestyle  . Physical activity:    Days per week: Not on file    Minutes per session: Not on file  . Stress: Not on file  Relationships  . Social connections:    Talks on phone: Not on file    Gets together: Not on file    Attends religious service: Not on file    Active member of club or organization: Not on file    Attends meetings of clubs or organizations: Not on file    Relationship status: Not on file  . Intimate partner violence:    Fear of current or ex partner: Not on file    Emotionally abused: Not on file    Physically abused: Not on file    Forced sexual activity: Not on file  Other Topics Concern  . Not on file  Social History Narrative  . Not on file    Past Surgical History:  Procedure Laterality Date  . KNEE SURGERY     2 knee transplants    Family History  Problem Relation Age of Onset  . Heart disease Mother   . Heart disease Father     No Known Allergies  Current Outpatient Medications on File Prior to Visit  Medication Sig Dispense Refill  . alendronate (FOSAMAX) 10 MG tablet Take 1 tablet (10 mg total) by mouth daily before  breakfast. Take with a full glass of water on an empty stomach. 30 tablet 11  . atorvastatin (LIPITOR) 20 MG tablet TAKE 1 TABLET (20 MG TOTAL) BY MOUTH DAILY. 90 tablet 1  . cyanocobalamin (,VITAMIN B-12,) 1000 MCG/ML injection Inject 1 mL (1,000 mcg total) into the muscle every 30 (thirty) days. 10 mL 0  . diclofenac (VOLTAREN) 75 MG EC tablet TAKE 1 TABLET TWICE A DAY 30 tablet 3  . fluticasone (FLONASE) 50 MCG/ACT nasal spray Place 2 sprays into both nostrils daily. 16 g 0  . hydrochlorothiazide (MICROZIDE) 12.5 MG capsule Take 1 capsule (12.5 mg total) by mouth daily. 30 capsule 0   No current facility-administered medications on file prior to visit.     BP 126/80   Pulse 68   Temp 98.5 F (36.9 C) (Oral)   Resp 16   Ht  5\' 3"  (1.6 m)   Wt 160 lb 3.2 oz (72.7 kg)   SpO2 100%   BMI 28.38 kg/m      Objective:   Physical Exam   General Mental Status- Alert. General Appearance- Not in acute distress.   Skin General: Color- Normal Color. Moisture- Normal Moisture.  Neck Carotid Arteries- Normal color. Moisture- Normal Moisture. No carotid bruits. No JVD.  Chest and Lung Exam Auscultation: Breath Sounds:-Normal.  Cardiovascular Auscultation:Rythm- Regular. Murmurs & Other Heart Sounds:Auscultation of the heart reveals- No Murmurs.  Abdomen Inspection:-Inspeection Normal. Palpation/Percussion:Note:No mass. Palpation and Percussion of the abdomen reveal- Non Tender, Non Distended + BS, no rebound or guarding.    Neurologic Cranial Nerve exam:- CN III-XII intact(No nystagmus), symmetric smile. Strength:- 5/5 equal and symmetric strength both upper and lower extremities.      Assessment & Plan:  Bp is well controlled continue current bp meds. Refill lisnopril today. Was on hctz in past but in computer not on for 2 years.  Mood well controlled. Continue sertraline.  For high cholesterol please get cmp and lipid panel.  Follow up date to be determined after lab review.  Esperanza Richters, PA-C

## 2018-02-24 ENCOUNTER — Telehealth: Payer: Self-pay | Admitting: Medical

## 2018-02-24 DIAGNOSIS — E875 Hyperkalemia: Secondary | ICD-10-CM

## 2018-02-24 NOTE — Telephone Encounter (Signed)
Copied from CRM [phone]. Topic: Quick Communication - See Telephone Encounter >> Feb 24, 2018 11:22 AM Oneal GroutSebastian, Jennifer S wrote: CRM for notification. See Telephone encounter for: 02/24/18. Patient states that Kayexalate was to be called in due to her lab results. [facility] Advise[facility] [place]

## 2018-02-25 NOTE — Telephone Encounter (Signed)
Patient calling because she still has not received Kayexalate which is suppose to be sent to  [id number] [place],  - [address]  [address] [place] [address]  Phone: [phone] Fax: [phone]   She is concerned that she might have a heart attack. She would like a call back to discuss treatment regarding the high potassium.

## 2018-02-26 ENCOUNTER — Telehealth: Payer: Self-pay | Admitting: Medical

## 2018-02-26 DIAGNOSIS — E875 Hyperkalemia: Secondary | ICD-10-CM

## 2018-02-26 NOTE — Telephone Encounter (Signed)
I talk with patient today to clarify situation regarding her mild high potassium.  I was never given an update regarding the questions I had regarding if patient was taking  Potassium supplement or ingesting high potassium foods until today.  Medical assistant  notified me of PEC communication with patient.  However I was never given answers to the questions I had.  Therefore did not write Kayexalate.  Since patient call back today inquiring about the Kayexalate I did call her pharmacy and our pharmacy.  Both pharmacies notified me of back order problems with Kayexalate and difficulty getting supply.  Her pharmacy expressed extreme difficulty in getting supply.  He stated likely would have difficulty.  Also he thinks that only one form of Kayexalate is available and she would probably have to get this for 453 g only for a few days treatment.  So I did talk with patient directly and we went over foods that she had eaten recently.  I she did admit eating a lot of greens/collars from her garden.  Also admitted liking white potatoes and eating spinach.  Also admits eating a lot of tomatoes.  However not any any bananas recently.  I did advise her to cut back on all the potassium rich foods which she has in her diet and I do want to repeat her metabolic panel in 1 week.  She thinks she will be able to come in next week but will be able to come in this week.  I advised her to call a day or 2 in advance to get put on the lab schedule.

## 2018-02-26 NOTE — Telephone Encounter (Signed)
Notified pt PCP wants her to come get lab redrawn. Pt states she will call and schedule lab appointment when she has tie to get in.

## 2018-02-26 NOTE — Telephone Encounter (Signed)
Order cmp placed.

## 2018-02-26 NOTE — Telephone Encounter (Signed)
Future cmp placed. 

## 2018-04-14 ENCOUNTER — Other Ambulatory Visit: Payer: Self-pay | Admitting: Medical

## 2018-06-23 DIAGNOSIS — K080 Exfoliation of teeth due to systemic causes: Secondary | ICD-10-CM | POA: Diagnosis not present

## 2018-07-08 DIAGNOSIS — K080 Exfoliation of teeth due to systemic causes: Secondary | ICD-10-CM | POA: Diagnosis not present

## 2018-07-15 ENCOUNTER — Other Ambulatory Visit: Payer: Self-pay | Admitting: Medical

## 2018-10-06 ENCOUNTER — Other Ambulatory Visit: Payer: Self-pay | Admitting: Medical

## 2018-12-11 DIAGNOSIS — H35372 Puckering of macula, left eye: Secondary | ICD-10-CM | POA: Diagnosis not present

## 2019-01-14 DIAGNOSIS — K080 Exfoliation of teeth due to systemic causes: Secondary | ICD-10-CM | POA: Diagnosis not present

## 2019-03-29 ENCOUNTER — Other Ambulatory Visit: Payer: Self-pay | Admitting: Medical

## 2019-03-30 ENCOUNTER — Encounter: Payer: Self-pay | Admitting: Medical

## 2019-03-30 ENCOUNTER — Other Ambulatory Visit: Payer: Self-pay

## 2019-03-30 ENCOUNTER — Ambulatory Visit (INDEPENDENT_AMBULATORY_CARE_PROVIDER_SITE_OTHER): Payer: Federal, State, Local not specified - PPO | Admitting: Medical

## 2019-03-30 VITALS — BP 152/82 | HR 65

## 2019-03-30 DIAGNOSIS — I10 Essential (primary) hypertension: Secondary | ICD-10-CM

## 2019-03-30 DIAGNOSIS — E785 Hyperlipidemia, unspecified: Secondary | ICD-10-CM | POA: Diagnosis not present

## 2019-03-30 DIAGNOSIS — R5383 Other fatigue: Secondary | ICD-10-CM

## 2019-03-30 DIAGNOSIS — Z87898 Personal history of other specified conditions: Secondary | ICD-10-CM

## 2019-03-30 MED ORDER — AMLODIPINE BESYLATE 2.5 MG PO TABS: 2.5000 mg | ORAL_TABLET | Freq: Every day | ORAL | 0 refills | Status: DC

## 2019-03-30 MED ORDER — LISINOPRIL 20 MG PO TABS: 20.0000 mg | ORAL_TABLET | Freq: Every day | ORAL | 1 refills | Status: DC

## 2019-03-30 MED ORDER — ATORVASTATIN CALCIUM 20 MG PO TABS: 20.0000 mg | ORAL_TABLET | Freq: Every day | ORAL | 1 refills | Status: DC

## 2019-03-30 NOTE — Patient Instructions (Addendum)
Patient has high blood pressure today but she reports usually her blood pressure is 130/80.  Daughter is a Engineer, civil (consulting) and checked her blood pressure today and it was 152/82.  I asked daughter to keep checking patient's blood pressure daily.  The blood pressure is over 140/90 then go ahead and add amlodipine 2.5mg  dose to her current lisinopril.  Recent fatigue and will arrange for patient to come in to get a CBC, CMP, TSH, iron and B12 level.  She has a history of high cholesterol and also will get fasting lipid panel.  Patient had chest pain 2 weeks ago that lasted for about 2 hours.  No recurrent chest pain since that event.  No associated cardiac type signs symptoms reported since that event either.  Did explain in detail to patient and daughter that she has any recurrent chest pain again then needs to be seen in the emergency department.  COVID virus is a concern and explained that there would be less of a chance to get COVID exposure and smaller emergency department.  Recommended medicine or ED if necessary.  Also it is highly unlikely that a troponin which show up on blood work when done later this week.  However daughter did asked for that to be done and I went ahead and place that order.  Note also will send a message through reception staff to see if patient wants to get EKG/have office visit with me on Wednesday.  This might be helpful.  Follow-up date to be determined after lab review.

## 2019-03-30 NOTE — Progress Notes (Signed)
Subjective:    Patient ID: Kayla Clark, female    DOB: Jun 21, 1946, 73 y.o.   MRN: [id number]  HPI  Virtual Visit via Telephone Note  I connected with Kayla Clark on 03/30/19 at 11:00 AM EDT by telephone and verified that I am speaking with the correct person using two identifiers.  Location: Patient: home Provider: home  Daughter who is a nurse checked her blood pressure.   I discussed the limitations, risks, security and privacy concerns of performing an evaluation and management service by telephone and the availability of in person appointments. I also discussed with the patient that there may be a patient responsible charge related to this service. The patient expressed understanding and agreed to proceed.   History of Present Illness: Pt states she is doing overall ok.  Pt states still having some daily fatigue. She has complained of this in past as well. She has been working delivering mail still.  Pt has htn. No cardiac or neurologic signs or symptoms presently. Pt states most of time 130/80. Today reading is high.  Pt has high cholesterol. She is on lipitor.   2 weeks ago pt had 2 hours of chest pain. Then it went away. No reoccurrence. No other signs and symptoms reported at that time. No reoccurrence.  The 10-year ASCVD risk score Denman George DC Montez Hageman., et al., 2013) is: 18.2%   Values used to calculate the score:     Age: 14 years     Sex: Female     Is Non-Hispanic African American: No     Diabetic: No     Tobacco smoker: No     Systolic Blood Pressure: 152 mmHg     Is BP treated: Yes     HDL Cholesterol: 54.5 mg/dL     Total Cholesterol: 160 mg/dL     Observations/Objective: General- no acute distress. Speaking in normal tone. Alert.  Assessment and Plan: Patient has high blood pressure today but she reports usually her blood pressure is 130/80.  Daughter is a Engineer, civil (consulting) and checked her blood pressure today and it was 152/82.  I asked daughter to keep  checking patient's blood pressure daily.  The blood pressure is over 140/90 then go ahead and add amlodipine 2.5mg  dose to her current lisinopril.  Recent fatigue and will arrange for patient to come in to get a CBC, CMP, TSH, iron and B12 level.  She has a history of high cholesterol and also will get fasting lipid panel.  Patient had chest pain 2 weeks ago that lasted for about 2 hours.  No recurrent chest pain since that event.  No associated cardiac type signs symptoms reported since that event either.  Did explain in detail to patient and daughter that she has any recurrent chest pain again then needs to be seen in the emergency department.  COVID virus is a concern and explained that there would be less of a chance to get COVID exposure and smaller emergency department.  Recommended medicine or ED if necessary.  Also it is highly unlikely that a troponin which show up on blood work when done later this week.  However daughter did asked for that to be done and I went ahead and place that order.  Follow-up date to be determined after lab review.  Follow Up Instructions:    I discussed the assessment and treatment plan with the patient. The patient was provided an opportunity to ask questions and all were answered. The patient agreed  with the plan and demonstrated an understanding of the instructions.   The patient was advised to call back or seek an in-person evaluation if the symptoms worsen or if the condition fails to improve as anticipated.     Esperanza Richters, PA-C    Review of Systems  Constitutional: Positive for fatigue. Negative for chills and fever.  Respiratory: Negative for cough, chest tightness, shortness of breath and wheezing.   Cardiovascular: Negative for chest pain and palpitations.  Gastrointestinal: Negative for abdominal pain.  Neurological: Negative for dizziness, speech difficulty, weakness, numbness and headaches.  Hematological: Negative for adenopathy. Does  not bruise/bleed easily.  Psychiatric/Behavioral: Negative for confusion.   Past Medical History:  Diagnosis Date  . Hypertension    hx of borderline bp in past. Never was on meds.     Social History   Socioeconomic History  . Marital status: Widowed    Spouse name: Not on file  . Number of children: Not on file  . Years of education: Not on file  . Highest education level: Not on file  Occupational History  . Not on file  Social Needs  . Financial resource strain: Not on file  . Food insecurity:    Worry: Not on file    Inability: Not on file  . Transportation needs:    Medical: Not on file    Non-medical: Not on file  Tobacco Use  . Smoking status: Never Smoker  . Smokeless tobacco: Never Used  Substance and Sexual Activity  . Alcohol use: No    Alcohol/week: 0.0 standard drinks  . Drug use: No  . Sexual activity: Not on file  Lifestyle  . Physical activity:    Days per week: Not on file    Minutes per session: Not on file  . Stress: Not on file  Relationships  . Social connections:    Talks on phone: Not on file    Gets together: Not on file    Attends religious service: Not on file    Active member of club or organization: Not on file    Attends meetings of clubs or organizations: Not on file    Relationship status: Not on file  . Intimate partner violence:    Fear of current or ex partner: Not on file    Emotionally abused: Not on file    Physically abused: Not on file    Forced sexual activity: Not on file  Other Topics Concern  . Not on file  Social History Narrative  . Not on file    Past Surgical History:  Procedure Laterality Date  . KNEE SURGERY     2 knee transplants    Family History  Problem Relation Age of Onset  . Heart disease Mother   . Heart disease Father     No Known Allergies  Current Outpatient Medications on File Prior to Visit  Medication Sig Dispense Refill  . alendronate (FOSAMAX) 10 MG tablet Take 1 tablet (10 mg  total) by mouth daily before breakfast. Take with a full glass of water on an empty stomach. 30 tablet 11  . cyanocobalamin (,VITAMIN B-12,) 1000 MCG/ML injection Inject 1 mL (1,000 mcg total) into the muscle every 30 (thirty) days. 10 mL 0  . diclofenac (VOLTAREN) 75 MG EC tablet TAKE 1 TABLET TWICE A DAY 30 tablet 3  . fluticasone (FLONASE) 50 MCG/ACT nasal spray Place 2 sprays into both nostrils daily. 16 g 0  . hydrochlorothiazide (MICROZIDE) 12.5 MG capsule  Take 1 capsule (12.5 mg total) by mouth daily. 30 capsule 0  . sertraline (ZOLOFT) 50 MG tablet TAKE 1 TABLET BY MOUTH EVERY DAY 90 tablet 1   No current facility-administered medications on file prior to visit.     BP (!) 152/82   Pulse 65       Objective:   Physical Exam        Assessment & Plan:

## 2019-04-21 ENCOUNTER — Other Ambulatory Visit: Payer: Self-pay | Admitting: Medical

## 2019-05-14 ENCOUNTER — Other Ambulatory Visit: Payer: Self-pay | Admitting: Medical

## 2019-06-07 DIAGNOSIS — F419 Anxiety disorder, unspecified: Secondary | ICD-10-CM | POA: Diagnosis not present

## 2019-06-07 DIAGNOSIS — W293XXA Contact with powered garden and outdoor hand tools and machinery, initial encounter: Secondary | ICD-10-CM | POA: Diagnosis not present

## 2019-06-07 DIAGNOSIS — I10 Essential (primary) hypertension: Secondary | ICD-10-CM | POA: Diagnosis not present

## 2019-06-07 DIAGNOSIS — S61210A Laceration without foreign body of right index finger without damage to nail, initial encounter: Secondary | ICD-10-CM | POA: Diagnosis not present

## 2019-06-07 DIAGNOSIS — Z791 Long term (current) use of non-steroidal anti-inflammatories (NSAID): Secondary | ICD-10-CM | POA: Diagnosis not present

## 2019-06-07 DIAGNOSIS — G8911 Acute pain due to trauma: Secondary | ICD-10-CM | POA: Diagnosis not present

## 2019-06-07 DIAGNOSIS — Z79899 Other long term (current) drug therapy: Secondary | ICD-10-CM | POA: Diagnosis not present

## 2019-06-07 DIAGNOSIS — Z23 Encounter for immunization: Secondary | ICD-10-CM | POA: Diagnosis not present

## 2019-06-07 DIAGNOSIS — S62620A Displaced fracture of medial phalanx of right index finger, initial encounter for closed fracture: Secondary | ICD-10-CM | POA: Diagnosis not present

## 2019-06-09 DIAGNOSIS — S61219A Laceration without foreign body of unspecified finger without damage to nail, initial encounter: Secondary | ICD-10-CM | POA: Diagnosis not present

## 2019-06-09 DIAGNOSIS — S62650B Nondisplaced fracture of medial phalanx of right index finger, initial encounter for open fracture: Secondary | ICD-10-CM | POA: Diagnosis not present

## 2019-06-15 DIAGNOSIS — S61219A Laceration without foreign body of unspecified finger without damage to nail, initial encounter: Secondary | ICD-10-CM | POA: Diagnosis not present

## 2019-07-16 ENCOUNTER — Telehealth: Payer: Self-pay

## 2019-07-16 NOTE — Telephone Encounter (Signed)
Pt did not get lab work done at last appointment. Left pt a message to schedule lab appointment.

## 2019-09-28 ENCOUNTER — Other Ambulatory Visit: Payer: Self-pay | Admitting: Medical

## 2019-11-09 ENCOUNTER — Other Ambulatory Visit: Payer: Self-pay | Admitting: *Deleted

## 2019-11-09 MED ORDER — SERTRALINE HCL 50 MG PO TABS: 50.0000 mg | ORAL_TABLET | Freq: Every day | ORAL | 0 refills | Status: DC

## 2019-12-08 ENCOUNTER — Ambulatory Visit: Payer: Federal, State, Local not specified - PPO | Attending: Internal Medicine

## 2019-12-08 DIAGNOSIS — Z23 Encounter for immunization: Secondary | ICD-10-CM | POA: Insufficient documentation

## 2019-12-08 NOTE — Progress Notes (Signed)
   Covid-19 Vaccination Clinic  Name:  Kayla Clark    MRN: [id number] DOB: Apr 06, 1946  12/08/2019  Kayla Clark was observed post Covid-19 immunization for 15 minutes without incidence. She was provided with Vaccine Information Sheet and instruction to access the V-Safe system.   Kayla Clark was instructed to call 911 with any severe reactions post vaccine: Marland Kitchen Difficulty breathing  . Swelling of your face and throat  . A fast heartbeat  . A bad rash all over your body  . Dizziness and weakness    Immunizations Administered    Name Date Dose VIS Date Route   Pfizer COVID-19 Vaccine 12/08/2019  4:09 PM 0.3 mL 10/30/2019 Intramuscular   Manufacturer: [employer], [employer]   Lot: [id number]   NDC: [address]-0

## 2019-12-24 ENCOUNTER — Ambulatory Visit: Payer: Self-pay

## 2019-12-27 ENCOUNTER — Ambulatory Visit: Payer: Federal, State, Local not specified - PPO | Attending: Internal Medicine

## 2019-12-27 DIAGNOSIS — Z23 Encounter for immunization: Secondary | ICD-10-CM | POA: Insufficient documentation

## 2019-12-27 NOTE — Progress Notes (Signed)
   Covid-19 Vaccination Clinic  Name:  Kayla Clark    MRN: [id number] DOB: March 02, 1946  12/27/2019  [name] was observed post Covid-19 immunization for 15 minutes without incidence. She was provided with Vaccine Information Sheet and instruction to access the V-Safe system.   [name] was instructed to call 911 with any severe reactions post vaccine: Marland Kitchen Difficulty breathing  . Swelling of your face and throat  . A fast heartbeat  . A bad rash all over your body  . Dizziness and weakness    Immunizations Administered    Name Date Dose VIS Date Route   Pfizer COVID-19 Vaccine 12/27/2019  2:02 PM 0.3 mL 10/30/2019 Intramuscular   Manufacturer: [employer], [employer]   Lot: [id number]   NDC: [address]-4

## 2019-12-29 ENCOUNTER — Ambulatory Visit: Payer: Federal, State, Local not specified - PPO

## 2020-02-08 ENCOUNTER — Other Ambulatory Visit: Payer: Self-pay | Admitting: Medical

## 2020-03-31 ENCOUNTER — Other Ambulatory Visit: Payer: Self-pay | Admitting: Medical

## 2020-05-11 ENCOUNTER — Other Ambulatory Visit: Payer: Self-pay | Admitting: Medical

## 2020-08-02 ENCOUNTER — Other Ambulatory Visit: Payer: Self-pay | Admitting: Medical

## 2020-09-27 ENCOUNTER — Ambulatory Visit: Payer: Federal, State, Local not specified - PPO | Admitting: Medical

## 2020-09-27 ENCOUNTER — Telehealth: Payer: Self-pay | Admitting: Medical

## 2020-09-27 ENCOUNTER — Other Ambulatory Visit (HOSPITAL_BASED_OUTPATIENT_CLINIC_OR_DEPARTMENT_OTHER): Payer: Self-pay | Admitting: Internal Medicine

## 2020-09-27 ENCOUNTER — Encounter: Payer: Self-pay | Admitting: Medical

## 2020-09-27 ENCOUNTER — Other Ambulatory Visit: Payer: Self-pay

## 2020-09-27 ENCOUNTER — Ambulatory Visit: Payer: Federal, State, Local not specified - PPO | Attending: Internal Medicine

## 2020-09-27 ENCOUNTER — Ambulatory Visit (HOSPITAL_BASED_OUTPATIENT_CLINIC_OR_DEPARTMENT_OTHER)
Admission: RE | Admit: 2020-09-27 | Discharge: 2020-09-27 | Disposition: A | Discharge status: Home / Self Care | Payer: Federal, State, Local not specified - PPO | Source: Ambulatory Visit | Attending: Medical | Admitting: Medical

## 2020-09-27 VITALS — BP 128/70 | HR 73 | Resp 18 | Ht 63.0 in | Wt 161.0 lb

## 2020-09-27 DIAGNOSIS — Z23 Encounter for immunization: Secondary | ICD-10-CM

## 2020-09-27 DIAGNOSIS — I10 Essential (primary) hypertension: Secondary | ICD-10-CM

## 2020-09-27 DIAGNOSIS — R739 Hyperglycemia, unspecified: Secondary | ICD-10-CM

## 2020-09-27 DIAGNOSIS — R5383 Other fatigue: Secondary | ICD-10-CM

## 2020-09-27 DIAGNOSIS — R0781 Pleurodynia: Secondary | ICD-10-CM | POA: Diagnosis not present

## 2020-09-27 DIAGNOSIS — E785 Hyperlipidemia, unspecified: Secondary | ICD-10-CM

## 2020-09-27 MED ORDER — LISINOPRIL 10 MG PO TABS: 20.0000 mg | ORAL_TABLET | Freq: Every day | ORAL | 1 refills | Status: DC

## 2020-09-27 NOTE — Patient Instructions (Addendum)
Your blood pressure is well controlled today.  Refilled your lisinopril.  History of hyperlipidemia.  Continue current statin and will place future CMP and lipid panel to be done fasting.  History of mild elevated sugars in the past.  Will include 49-month sugar average test 1 year fasting labs are done.  For recent fatigue we will also get a TSH, T4, B12, B1 and iron level.  Recent intermittent transient left lower rib pain.  No pain presently.  Will get chest x-ray to evaluate anterior lower rib area.  You expressed concern about cardiac cause for your rib pain.  Symptoms are very atypical and I do not think they represent cardiac symptoms.  But based on family history, history of hypertension and your history of high cholesterol we will go ahead and get baseline EKG.  EKG today showed normal sinus rhythm(no ischemic changes).  Discussed today classic signs and symptoms indicating potential cardiac cause.  If you have such signs and symptoms after hours then recommend ED evaluation.  If you have atypical symptoms that seem more cardiac but then could refer you to a cardiologist as well.  Follow-up in 3 months or as needed.

## 2020-09-27 NOTE — Progress Notes (Signed)
Subjective:    Patient ID: Kayla Clark, female    DOB: [birth year], 74 y.o.   MRN: [id number]  HPI Pt in for follow up.  Pt last seen may 2020. Pt hast htn and high cholesterol. She is fatigued as well.   Bp is slight high. No gross motor or sensory function deficits.Pt is on lisniopril. 20 mg daily.  For high cholesterol pt is on atrovastain.  For elevated sugar have followed in past by labs.  For anxiety and decreased mood has been on sertraline. She feels this helps.   Recent fatigue. Still working at [employer].  Pt got flu vaccine today.  Pt up to date on covid vaccine.   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 No chest pain. Rare intermittent one minute pain. Last time had pain 2 days ago.     Review of Systems  Constitutional: Positive for fatigue. Negative for chills and fever.  HENT: Negative for congestion and ear pain.   Respiratory: Negative for cough, chest tightness, shortness of breath and wheezing.   Cardiovascular: Negative for chest pain and palpitations.  Gastrointestinal: Negative for abdominal pain.  Endocrine: Negative for polydipsia, polyphagia and polyuria.  Musculoskeletal: Negative for back pain and myalgias.       Anterior left lower rib pain.  Skin: Negative for rash.  Neurological: Negative for dizziness, speech difficulty, weakness, numbness and headaches.  Hematological: Negative for adenopathy. Does not bruise/bleed easily.  Psychiatric/Behavioral: Negative for behavioral problems, confusion, self-injury and sleep disturbance. The patient is not nervous/anxious.     Past Medical History:  Diagnosis Date   Hypertension    hx of borderline bp in past. Never was on meds.     Social History   Socioeconomic History   Marital status: Widowed    Spouse name: Not on file   Number of children: Not on file   Years of education: Not on file   Highest education level: Not on file  Occupational History    Not on file  Tobacco Use   Smoking status: Never Smoker   Smokeless tobacco: Never Used  Substance and Sexual Activity   Alcohol use: No    Alcohol/week: 0.0 standard drinks   Drug use: No   Sexual activity: Not on file  Other Topics Concern   Not on file  Social History Narrative   Not on file   Social Determinants of Health   Financial Resource Strain:    Difficulty of Paying Living Expenses: Not on file  Food Insecurity:    Worried About Running Out of Food in the Last Year: Not on file   Ran Out of Food in the Last Year: Not on file  Transportation Needs:    Lack of Transportation (Medical): Not on file   Lack of Transportation (Non-Medical): Not on file  Physical Activity:    Days of Exercise per Week: Not on file   Minutes of Exercise per Session: Not on file  Stress:    Feeling of Stress : Not on file  Social Connections:    Frequency of Communication with Friends and Family: Not on file   Frequency of Social Gatherings with Friends and Family: Not on file   Attends Religious Services: Not on file   Active Member of Clubs or Organizations: Not on file   Attends Banker Meetings: Not on file   Marital Status: Not on file  Intimate Partner Violence:    Fear of Current  or Ex-Partner: Not on file   Emotionally Abused: Not on file   Physically Abused: Not on file   Sexually Abused: Not on file    Past Surgical History:  Procedure Laterality Date   KNEE SURGERY     2 knee transplants    Family History  Problem Relation Age of Onset   Heart disease Mother    Heart disease Father     No Known Allergies  Current Outpatient Medications on File Prior to Visit  Medication Sig Dispense Refill   alendronate (FOSAMAX) 10 MG tablet Take 1 tablet (10 mg total) by mouth daily before breakfast. Take with a full glass of water on an empty stomach. 30 tablet 11   amLODipine (NORVASC) 2.5 MG tablet TAKE 1 TABLET BY MOUTH  EVERY DAY 30 tablet 0   atorvastatin (LIPITOR) 20 MG tablet TAKE 1 TABLET BY MOUTH EVERY DAY 90 tablet 1   cyanocobalamin (,VITAMIN B-12,) 1000 MCG/ML injection Inject 1 mL (1,000 mcg total) into the muscle every 30 (thirty) days. 10 mL 0   diclofenac (VOLTAREN) 75 MG EC tablet TAKE 1 TABLET TWICE A DAY 30 tablet 3   fluticasone (FLONASE) 50 MCG/ACT nasal spray Place 2 sprays into both nostrils daily. 16 g 0   hydrochlorothiazide (MICROZIDE) 12.5 MG capsule Take 1 capsule (12.5 mg total) by mouth daily. 30 capsule 0   lisinopril (ZESTRIL) 10 MG tablet TAKE 2 TABLETS ONCE A DAY 180 tablet 1   sertraline (ZOLOFT) 50 MG tablet TAKE 1 TABLET BY MOUTH EVERY DAY 90 tablet 0   No current facility-administered medications on file prior to visit.    BP 140/71    Pulse 73    Resp 18    Ht 5\' 3"  (1.6 m)    Wt 161 lb (73 kg)    SpO2 100%    BMI 28.52 kg/m       Objective:   Physical Exam  General Mental Status- Alert. General Appearance- Not in acute distress.   Skin General: Color- Normal Color. Moisture- Normal Moisture.  Neck Carotid Arteries- Normal color. Moisture- Normal Moisture. No carotid bruits. No JVD.  Chest and Lung Exam Auscultation: Breath Sounds:-Normal.  Cardiovascular Auscultation:Rythm- Regular. Murmurs & Other Heart Sounds:Auscultation of the heart reveals- No Murmurs.  Abdomen Inspection:-Inspeection Normal. Palpation/Percussion:Note:No mass. Palpation and Percussion of the abdomen reveal- Non Tender, Non Distended + BS, no rebound or guarding.   Neurologic Cranial Nerve exam:- CN III-XII intact(No nystagmus), symmetric smile. Strength:- 5/5 equal and symmetric strength both upper and lower extremities.      Assessment & Plan:  Your blood pressure is well controlled today.  Refilled your lisinopril.  History of hyperlipidemia.  Continue current statin and will place future CMP and lipid panel to be done fasting.  History of mild elevated sugars in  the past.  Will include 28-month sugar average test 1 year fasting labs are done.  For recent fatigue we will also get a TSH, T4, B12, B1 and iron level.  Recent intermittent transient left lower rib pain.  No pain presently.  Will get chest x-ray to evaluate anterior lower rib area.  You expressed concern about cardiac cause for your rib pain.  Symptoms are very atypical and I do not think they represent cardiac symptoms.  But based on family history, history of hypertension and your history of high cholesterol we will go ahead and get baseline EKG.  EKG today showed normal sinus rhythm.  Discussed today classic signs and symptoms  indicating potential cardiac cause.  If you have such signs and symptoms after hours then recommend ED evaluation.  If you have atypical symptoms that seem more cardiac but then could refer you to a cardiologist as well.  Follow-up in 3 months or as needed.  Time spent with patient today was40   minutes which consisted of chart review, discussing diagnoses, work up, treatment and documentation.

## 2020-09-27 NOTE — Telephone Encounter (Signed)
Future xray placed.

## 2020-09-29 ENCOUNTER — Ambulatory Visit (HOSPITAL_BASED_OUTPATIENT_CLINIC_OR_DEPARTMENT_OTHER)
Admission: RE | Admit: 2020-09-29 | Discharge: 2020-09-29 | Disposition: A | Discharge status: Home / Self Care | Payer: Federal, State, Local not specified - PPO | Source: Ambulatory Visit | Attending: Medical | Admitting: Medical

## 2020-09-29 ENCOUNTER — Telehealth: Payer: Self-pay | Admitting: Medical

## 2020-09-29 ENCOUNTER — Other Ambulatory Visit: Payer: Self-pay

## 2020-09-29 ENCOUNTER — Other Ambulatory Visit (INDEPENDENT_AMBULATORY_CARE_PROVIDER_SITE_OTHER): Payer: Federal, State, Local not specified - PPO

## 2020-09-29 DIAGNOSIS — M810 Age-related osteoporosis without current pathological fracture: Secondary | ICD-10-CM

## 2020-09-29 DIAGNOSIS — I10 Essential (primary) hypertension: Secondary | ICD-10-CM

## 2020-09-29 DIAGNOSIS — E785 Hyperlipidemia, unspecified: Secondary | ICD-10-CM

## 2020-09-29 DIAGNOSIS — R5383 Other fatigue: Secondary | ICD-10-CM | POA: Diagnosis not present

## 2020-09-29 DIAGNOSIS — S2242XA Multiple fractures of ribs, left side, initial encounter for closed fracture: Secondary | ICD-10-CM | POA: Diagnosis not present

## 2020-09-29 DIAGNOSIS — R739 Hyperglycemia, unspecified: Secondary | ICD-10-CM

## 2020-09-29 DIAGNOSIS — R0781 Pleurodynia: Secondary | ICD-10-CM | POA: Insufficient documentation

## 2020-09-29 LAB — COMPREHENSIVE METABOLIC PANEL
ALT: 13 U/L (ref 0–35)
AST: 16 U/L (ref 0–37)
Albumin: 4.2 g/dL (ref 3.5–5.2)
Alkaline Phosphatase: 100 U/L (ref 39–117)
BUN: 29 mg/dL — ABNORMAL HIGH (ref 6–23)
CO2: 26 mEq/L (ref 19–32)
Calcium: 9.3 mg/dL (ref 8.4–10.5)
Chloride: 102 mEq/L (ref 96–112)
Creatinine, Ser: 1.1 mg/dL (ref 0.40–1.20)
GFR: 49.99 mL/min — ABNORMAL LOW (ref >60.00)
Glucose, Bld: 86 mg/dL (ref 70–99)
Potassium: 4.6 mEq/L (ref 3.5–5.1)
Sodium: 136 mEq/L (ref 135–145)
Total Bilirubin: 0.6 mg/dL (ref 0.2–1.2)
Total Protein: 7 g/dL (ref 6.0–8.3)

## 2020-09-29 LAB — LIPID PANEL
Cholesterol: 163 mg/dL (ref 0–200)
HDL: 59 mg/dL (ref >39.00)
LDL Cholesterol: 88 mg/dL (ref 0–99)
NonHDL: 103.82
Total CHOL/HDL Ratio: 3
Triglycerides: 80 mg/dL (ref 0.0–149.0)
VLDL: 16 mg/dL (ref 0.0–40.0)

## 2020-09-29 LAB — TSH: TSH: 1.74 u[IU]/mL (ref 0.35–4.50)

## 2020-09-29 LAB — VITAMIN D 25 HYDROXY (VIT D DEFICIENCY, FRACTURES): VITD: 25.03 ng/mL — ABNORMAL LOW (ref 30.00–100.00)

## 2020-09-29 LAB — T4, FREE: Free T4: 0.98 ng/dL (ref 0.60–1.60)

## 2020-09-29 LAB — HEMOGLOBIN A1C: Hgb A1c MFr Bld: 6 % (ref 4.6–6.5)

## 2020-09-29 LAB — VITAMIN B12: Vitamin B-12: 373 pg/mL (ref 211–911)

## 2020-09-29 MED ORDER — ALENDRONATE SODIUM 10 MG PO TABS: 10.0000 mg | ORAL_TABLET | Freq: Every day | ORAL | 11 refills | Status: DC

## 2020-09-29 MED ORDER — VITAMIN D (ERGOCALCIFEROL) 1.25 MG (50000 UNIT) PO CAPS: 50000.0000 [IU] | ORAL_CAPSULE | ORAL | 0 refills | Status: AC

## 2020-09-29 NOTE — Addendum Note (Signed)
Addended by: Mervin Kung A on: 09/29/2020 07:46 AM   Modules accepted: Orders

## 2020-09-29 NOTE — Addendum Note (Signed)
Addended by: Mervin Kung A on: 09/29/2020 07:48 AM   Modules accepted: Orders

## 2020-09-29 NOTE — Telephone Encounter (Signed)
Rx fosamax, dexa scan order and vit D.

## 2020-09-30 MED FILL — PFIZER-BIONTECH COVID-19 VA: 30 | 1 days supply | Qty: 0 | Fill #0

## 2020-10-03 LAB — VITAMIN B1: Vitamin B1 (Thiamine): 10 nmol/L (ref 8–30)

## 2020-10-05 ENCOUNTER — Other Ambulatory Visit: Payer: Federal, State, Local not specified - PPO

## 2020-10-15 ENCOUNTER — Other Ambulatory Visit: Payer: Self-pay | Admitting: Medical

## 2020-10-29 ENCOUNTER — Other Ambulatory Visit: Payer: Self-pay | Admitting: Medical

## 2020-12-28 ENCOUNTER — Ambulatory Visit: Payer: Federal, State, Local not specified - PPO | Admitting: Medical

## 2020-12-28 ENCOUNTER — Other Ambulatory Visit: Payer: Self-pay

## 2020-12-28 ENCOUNTER — Encounter: Payer: Self-pay | Admitting: Medical

## 2020-12-28 VITALS — BP 132/65 | HR 79 | Temp 98.1°F | Resp 18 | Ht 63.0 in | Wt 159.0 lb

## 2020-12-28 DIAGNOSIS — R739 Hyperglycemia, unspecified: Secondary | ICD-10-CM | POA: Diagnosis not present

## 2020-12-28 DIAGNOSIS — Z87898 Personal history of other specified conditions: Secondary | ICD-10-CM | POA: Diagnosis not present

## 2020-12-28 DIAGNOSIS — E785 Hyperlipidemia, unspecified: Secondary | ICD-10-CM

## 2020-12-28 DIAGNOSIS — I10 Essential (primary) hypertension: Secondary | ICD-10-CM

## 2020-12-28 DIAGNOSIS — M2012 Hallux valgus (acquired), left foot: Secondary | ICD-10-CM

## 2020-12-28 DIAGNOSIS — R0781 Pleurodynia: Secondary | ICD-10-CM

## 2020-12-28 LAB — COMPREHENSIVE METABOLIC PANEL
ALT: 11 U/L (ref 0–35)
AST: 14 U/L (ref 0–37)
Albumin: 4.2 g/dL (ref 3.5–5.2)
Alkaline Phosphatase: 112 U/L (ref 39–117)
BUN: 27 mg/dL — ABNORMAL HIGH (ref 6–23)
CO2: 26 mEq/L (ref 19–32)
Calcium: 9.6 mg/dL (ref 8.4–10.5)
Chloride: 105 mEq/L (ref 96–112)
Creatinine, Ser: 0.92 mg/dL (ref 0.40–1.20)
GFR: 61.84 mL/min (ref >60.00)
Glucose, Bld: 83 mg/dL (ref 70–99)
Potassium: 4.5 mEq/L (ref 3.5–5.1)
Sodium: 138 mEq/L (ref 135–145)
Total Bilirubin: 0.7 mg/dL (ref 0.2–1.2)
Total Protein: 7 g/dL (ref 6.0–8.3)

## 2020-12-28 NOTE — Patient Instructions (Addendum)
Your blood pressure is well controlled today.  Continue lisinopril 20 mg daily.  Repeat metabolic panel today.  History of hyperlipidemia and recent lipid panel in November showed good control.  Continue atorvastatin 20 mg daily.  Elevated sugar in the past and in November A1c was in the prediabetic range.  Recommend low sugar diet.  Not able to do A1c today since 90 days has not expired since your last visit. Here in the office will get A1c.  History of atypical left lower rib pain with some occasional epigastric pain.  Also known family history of CAD.  Negative EKG last time and negative chest x-ray for acute injury.  Symptoms do not sound cardiac but you expressed concern so decided to go ahead and refer you to cardiologist.  Left foot great toe hallux valgus.  Offered referral to podiatrist but declined.  Let me know if you change your mind and I will make that referral.  Follow-up in 3 months or as needed.

## 2020-12-28 NOTE — Progress Notes (Signed)
Subjective:    Patient ID: Kayla Clark, female    DOB: Jul 05, 1946, 75 y.o.   MRN: [id number]  HPI  Pt in for follow up.  Pt bp is elevated today. Pt does not check her bp at home. She is on lisinopril 10 mg daily. On recheck better 132/65.  Pt has high cholesterol hx. On atorvastatin 20 mg daily. 3 month ago lipid very good.  Hx of elevated sugar. A1c in prediabetic range.  Pt has hx of intermittent mild left lower rib area pain. Had some last week 3 times. Lasted about 10-15 minutes eat time. One time epigastric about one week. Lasted briefly. Pt had ekg done last visit for rib pain complaint. Mom and Dad both had CAD. For this reason pt worries about her heart health. No current pain. No current associated cardiac like signs or symptoms.    Review of Systems  Constitutional: Negative for chills, diaphoresis and fatigue.  HENT: Negative for congestion.   Respiratory: Negative for cough, chest tightness, shortness of breath and wheezing.   Cardiovascular: Negative for chest pain and palpitations.  Gastrointestinal: Positive for abdominal pain.       Rare occaional egpigastric pain.  Genitourinary: Negative for dysuria.  Musculoskeletal:       Left lower rib area pain.  Skin: Negative for rash.  Neurological: Negative for dizziness, seizures, weakness, numbness and headaches.  Hematological: Negative for adenopathy. Does not bruise/bleed easily.  Psychiatric/Behavioral: Negative for behavioral problems, confusion and decreased concentration.    Past Medical History:  Diagnosis Date  . Hypertension    hx of borderline bp in past. Never was on meds.     Social History   Socioeconomic History  . Marital status: Widowed    Spouse name: Not on file  . Number of children: Not on file  . Years of education: Not on file  . Highest education level: Not on file  Occupational History  . Not on file  Tobacco Use  . Smoking status: Never Smoker  . Smokeless tobacco: Never  Used  Substance and Sexual Activity  . Alcohol use: No    Alcohol/week: 0.0 standard drinks  . Drug use: No  . Sexual activity: Not on file  Other Topics Concern  . Not on file  Social History Narrative  . Not on file   Social Determinants of Health   Financial Resource Strain: Not on file  Food Insecurity: Not on file  Transportation Needs: Not on file  Physical Activity: Not on file  Stress: Not on file  Social Connections: Not on file  Intimate Partner Violence: Not on file    Past Surgical History:  Procedure Laterality Date  . KNEE SURGERY     2 knee transplants    Family History  Problem Relation Age of Onset  . Heart disease Mother   . Heart disease Father     No Known Allergies  Current Outpatient Medications on File Prior to Visit  Medication Sig Dispense Refill  . alendronate (FOSAMAX) 10 MG tablet Take 1 tablet (10 mg total) by mouth daily before breakfast. Take with a full glass of water on an empty stomach. 30 tablet 11  . atorvastatin (LIPITOR) 20 MG tablet TAKE 1 TABLET BY MOUTH EVERY DAY 90 tablet 1  . cyanocobalamin (,VITAMIN B-12,) 1000 MCG/ML injection Inject 1 mL (1,000 mcg total) into the muscle every 30 (thirty) days. 10 mL 0  . diclofenac (VOLTAREN) 75 MG EC tablet TAKE 1 TABLET TWICE  A DAY 30 tablet 3  . fluticasone (FLONASE) 50 MCG/ACT nasal spray Place 2 sprays into both nostrils daily. 16 g 0  . lisinopril (ZESTRIL) 10 MG tablet Take 2 tablets (20 mg total) by mouth daily. 180 tablet 1  . sertraline (ZOLOFT) 50 MG tablet TAKE 1 TABLET BY MOUTH EVERY DAY 90 tablet 0  . Vitamin D, Ergocalciferol, (DRISDOL) 1.25 MG (50000 UNIT) CAPS capsule Take 1 capsule (50,000 Units total) by mouth every 7 (seven) days. 8 capsule 0   No current facility-administered medications on file prior to visit.    BP (!) 145/77   Pulse 79   Temp 98.1 F (36.7 C) (Oral)   Resp 18   Ht 5\' 3"  (1.6 m)   Wt 159 lb (72.1 kg)   SpO2 98%   BMI 28.17 kg/m       Objective:   Physical Exam   General Mental Status- Alert. General Appearance- Not in acute distress.   Skin General: Color- Normal Color. Moisture- Normal Moisture.  Neck Carotid Arteries- Normal color. Moisture- Normal Moisture. No carotid bruits. No JVD.  Chest and Lung Exam Auscultation: Breath Sounds:-Normal.  Cardiovascular Auscultation:Rhythm- Regular. Murmurs & Other Heart Sounds:Auscultation of the heart reveals- No Murmurs.  Abdomen Inspection:-Inspection Normal. Palpation/Percussion:Note:No mass. Palpation and Percussion of the abdomen reveal- Non Tender, Non Distended + BS, no rebound or guarding.   Neurologic Cranial Nerve exam:- CN III-XII intact(No nystagmus), symmetric smile. Strength:- 5/5 equal and symmetric strength both upper and lower extremities.  Left foot- obvious hallux valgus.    Assessment & Plan:  Your blood pressure is well controlled today.  Continue lisinopril 20 mg daily.  Repeat metabolic panel today.  History of hyperlipidemia and recent lipid panel in November showed good control.  Continue atorvastatin 20 mg daily.  Elevated sugar in the past and in November A1c was in the prediabetic range.  Recommend low sugar diet.  Not able to do A1c today since 90 days has not expired since your last visit. Here in the office will get A1c.  History of atypical left lower rib pain with some occasional epigastric pain.  Also known family history of CAD.  Negative EKG last time and negative chest x-ray for acute injury.  Symptoms do not sound cardiac but you expressed concern so decided to go ahead and refer you to cardiologist.  Left foot great toe hallux valgus.  Offered referral to podiatrist but declined.  Let me know if you change your mind and I will make that referral.  Follow-up in 3 months or as needed.  December, PA-C

## 2021-01-03 DIAGNOSIS — I10 Essential (primary) hypertension: Secondary | ICD-10-CM | POA: Insufficient documentation

## 2021-01-05 ENCOUNTER — Encounter: Payer: Self-pay | Admitting: Cardiology

## 2021-01-05 ENCOUNTER — Ambulatory Visit: Payer: Federal, State, Local not specified - PPO | Admitting: Cardiology

## 2021-01-05 ENCOUNTER — Other Ambulatory Visit: Payer: Self-pay

## 2021-01-05 VITALS — BP 120/72 | HR 70 | Ht 63.0 in | Wt 160.0 lb

## 2021-01-05 DIAGNOSIS — E782 Mixed hyperlipidemia: Secondary | ICD-10-CM | POA: Diagnosis not present

## 2021-01-05 DIAGNOSIS — E559 Vitamin D deficiency, unspecified: Secondary | ICD-10-CM

## 2021-01-05 DIAGNOSIS — Z8249 Family history of ischemic heart disease and other diseases of the circulatory system: Secondary | ICD-10-CM

## 2021-01-05 DIAGNOSIS — R072 Precordial pain: Secondary | ICD-10-CM

## 2021-01-05 DIAGNOSIS — I10 Essential (primary) hypertension: Secondary | ICD-10-CM

## 2021-01-05 DIAGNOSIS — R7303 Prediabetes: Secondary | ICD-10-CM

## 2021-01-05 DIAGNOSIS — R0789 Other chest pain: Secondary | ICD-10-CM

## 2021-01-05 MED ORDER — METOPROLOL TARTRATE 100 MG PO TABS: ORAL_TABLET | ORAL | 0 refills | Status: DC

## 2021-01-05 MED ORDER — NITROGLYCERIN 0.4 MG SL SUBL: 0.4000 mg | SUBLINGUAL_TABLET | SUBLINGUAL | 3 refills | Status: DC | PRN

## 2021-01-05 NOTE — Patient Instructions (Signed)
Medication Instructions:  Your physician has recommended you make the following change in your medication: START: Nitroglycerin 0.4 mg take one tablet by mouth every 5 minutes up to three times as needed for chest pain.  *If you need a refill on your cardiac medications before your next appointment, please call your pharmacy*   Lab Work: Your physician recommends that you return for lab work: TODAY BMET, Mag  If you have labs (blood work) drawn today and your tests are completely normal, you will receive your results only by: Marland Kitchen MyChart Message (if you have MyChart) OR . A paper copy in the mail If you have any lab test that is abnormal or we need to change your treatment, we will call you to review the results.   Testing/Procedures: Your cardiac CT will be scheduled at:  [facility] [address] [place], [place] [address] [phone]   If scheduled at [facility], please arrive at the [facility] main entrance (entrance A) of [facility] 30 minutes prior to test start time. Proceed to the [facility] Radiology Department (first floor) to check-in and test prep.   Please follow these instructions carefully (unless otherwise directed):  On the Night Before the Test: . Be sure to Drink plenty of water. . Do not consume any caffeinated/decaffeinated beverages or chocolate 12 hours prior to your test. . Do not take any antihistamines 12 hours prior to your test.  On the Day of the Test: . Drink plenty of water until 1 hour prior to the test. . Do not eat any food 4 hours prior to the test. . You may take your regular medications prior to the test.  . Take metoprolol (Lopressor) two hours prior to test. . FEMALES- please wear underwire-free bra if available       After the Test: . Drink plenty of water. . After receiving IV contrast, you may experience a mild flushed feeling. This is normal. . On occasion, you may experience a mild rash up to  24 hours after the test. This is not dangerous. If this occurs, you can take Benadryl 25 mg and increase your fluid intake. . If you experience trouble breathing, this can be serious. If it is severe call 911 IMMEDIATELY. If it is mild, please call our office.    Once we have confirmed authorization from your insurance company, we will call you to set up a date and time for your test. Based on how quickly your insurance processes prior authorizations requests, please allow up to 4 weeks to be contacted for scheduling your Cardiac CT appointment. Be advised that routine Cardiac CT appointments could be scheduled as many as 8 weeks after your provider has ordered it.  For non-scheduling related questions, please contact the cardiac imaging nurse navigator should you have any questions/concerns: Rockwell Alexandria, Cardiac Imaging Nurse Navigator Larey Brick, Cardiac Imaging Nurse Navigator [facility] Direct Office Dial: [phone]   For scheduling needs, including cancellations and rescheduling, please call [place], [phone].     Follow-Up: At [facility], you and your health needs are our priority.  As part of our continuing mission to provide you with exceptional heart care, we have created designated Provider Care Teams.  These Care Teams include your primary Cardiologist (physician) and Advanced Practice Providers (APPs -  Physician Assistants and Nurse Practitioners) who all work together to provide you with the care you need, when you need it.  We recommend signing  up for the patient portal called "MyChart".  Sign up information is provided on this After Visit Summary.  MyChart is used to connect with patients for Virtual Visits (Telemedicine).  Patients are able to view lab/test results, encounter notes, upcoming appointments, etc.  Non-urgent messages can be sent to your provider as well.   To learn more about what you can do with MyChart, go to  [email or web address].    Your next appointment:   3 month(s)  The format for your next appointment:   In Person  Provider:   Thomasene Ripple, DO   Other Instructions

## 2021-01-05 NOTE — Progress Notes (Signed)
Cardiology Office Note:    Date:  01/05/2021   ID:  Kayla Clark, DOB [birth year], MRN [id number]  PCP:  Esperanza Richters, PA-C  Cardiologist:  Thomasene Ripple, DO  Electrophysiologist:  None   Referring MD: Marisue Brooklyn   I have been having chest pain  History of Present Illness:    Kayla Clark is a 74 y.o. female with a hx of hypertension, prediabetes, hyperlipidemia GERD, vitamin D deficiency is here today at the request of her PCP to be evaluated for chest pain.  Patient tells me she has been experiencing intermittent left-sided chest pain.  Sometimes she tells me is sharp and sometimes under her breast but sometimes on her left upper chest wall.  It last for few seconds and then resolves.  The frequency is a lot recently and that has had her concerned given the fact that her mother had premature coronary artery disease.  No shortness of breath, no lightheadedness no dizziness.  Past Medical History:  Diagnosis Date  . Esophageal reflux   . Gastro-esophageal reflux disease with esophagitis 06/23/2009   Formatting of this note might be different from the original. Chronic Reflux Esophagitis  . Hypertension    hx of borderline bp in past. Never was on meds.  . Osteoarthritis     Past Surgical History:  Procedure Laterality Date  . KNEE SURGERY     2 knee transplants    Current Medications: Current Meds  Medication Sig  . atorvastatin (LIPITOR) 20 MG tablet TAKE 1 TABLET BY MOUTH EVERY DAY  . fluticasone (FLONASE) 50 MCG/ACT nasal spray Place 2 sprays into both nostrils daily.  Marland Kitchen lisinopril (ZESTRIL) 10 MG tablet Take 2 tablets (20 mg total) by mouth daily.  . sertraline (ZOLOFT) 50 MG tablet TAKE 1 TABLET BY MOUTH EVERY DAY  . Vitamin D, Ergocalciferol, (DRISDOL) 1.25 MG (50000 UNIT) CAPS capsule Take 1 capsule (50,000 Units total) by mouth every 7 (seven) days.     Allergies:   Patient has no known allergies.   Social History   Socioeconomic History  .  Marital status: Widowed    Spouse name: Not on file  . Number of children: Not on file  . Years of education: Not on file  . Highest education level: Not on file  Occupational History  . Not on file  Tobacco Use  . Smoking status: Never Smoker  . Smokeless tobacco: Never Used  Substance and Sexual Activity  . Alcohol use: No    Alcohol/week: 0.0 standard drinks  . Drug use: No  . Sexual activity: Not on file  Other Topics Concern  . Not on file  Social History Narrative  . Not on file   Social Determinants of Health   Financial Resource Strain: Not on file  Food Insecurity: Not on file  Transportation Needs: Not on file  Physical Activity: Not on file  Stress: Not on file  Social Connections: Not on file     Family History: The patient's family history includes Heart disease in her father and mother.  ROS:   Review of Systems  Constitution: Negative for decreased appetite, fever and weight gain.  HENT: Negative for congestion, ear discharge, hoarse voice and sore throat.   Eyes: Negative for discharge, redness, vision loss in right eye and visual halos.  Cardiovascular: Reports chest pain.  Negative for , dyspnea on exertion, leg swelling, orthopnea and palpitations.  Respiratory: Negative for cough, hemoptysis, shortness of breath and snoring.   Endocrine:  Negative for heat intolerance and polyphagia.  Hematologic/Lymphatic: Negative for bleeding problem. Does not bruise/bleed easily.  Skin: Negative for flushing, nail changes, rash and suspicious lesions.  Musculoskeletal: Negative for arthritis, joint pain, muscle cramps, myalgias, neck pain and stiffness.  Gastrointestinal: Negative for abdominal pain, bowel incontinence, diarrhea and excessive appetite.  Genitourinary: Negative for decreased libido, genital sores and incomplete emptying.  Neurological: Negative for brief paralysis, focal weakness, headaches and loss of balance.  Psychiatric/Behavioral: Negative  for altered mental status, depression and suicidal ideas.  Allergic/Immunologic: Negative for HIV exposure and persistent infections.    EKGs/Labs/Other Studies Reviewed:    The following studies were reviewed today:   EKG:  The ekg ordered today demonstrates sinus rhythm, heart rate 70 bpm  Recent Labs: 09/29/2020: TSH 1.74 12/28/2020: ALT 11; BUN 27; Creatinine, Ser 0.92; Potassium 4.5; Sodium 138  Recent Lipid Panel    Component Value Date/Time   CHOL 163 09/29/2020 0750   TRIG 80.0 09/29/2020 0750   HDL 59.00 09/29/2020 0750   CHOLHDL 3 09/29/2020 0750   VLDL 16.0 09/29/2020 0750   LDLCALC 88 09/29/2020 0750    Physical Exam:    VS:  BP 120/72 (BP Location: Left Arm, Patient Position: Sitting, Cuff Size: Normal)   Pulse 70   Ht 5\' 3"  (1.6 m)   Wt 160 lb (72.6 kg)   SpO2 97%   BMI 28.34 kg/m     Wt Readings from Last 3 Encounters:  01/05/21 160 lb (72.6 kg)  12/28/20 159 lb (72.1 kg)  09/27/20 161 lb (73 kg)     GEN: Well nourished, well developed in no acute distress HEENT: Normal NECK: No JVD; No carotid bruits LYMPHATICS: No lymphadenopathy CARDIAC: S1S2 noted,RRR, no murmurs, rubs, gallops RESPIRATORY:  Clear to auscultation without rales, wheezing or rhonchi  ABDOMEN: Soft, non-tender, non-distended, +bowel sounds, no guarding. EXTREMITIES: No edema, No cyanosis, no clubbing MUSCULOSKELETAL:  No deformity  SKIN: Warm and dry NEUROLOGIC:  Alert and oriented x 3, non-focal PSYCHIATRIC:  Normal affect, good insight  ASSESSMENT:    1. Primary hypertension   2. Precordial pain   3. Mixed hyperlipidemia   4. Vitamin D deficiency   5. Prediabetes   6. Family history of premature CAD    PLAN:     Her chest pain is concerning patient does have intermediate risk for coronary artery disease would not like to do is pursue an ischemic evaluation in this patient.  Shared decision a coronary CTA at this time is appropriate.  I have discussed with the patient  about the testing.  The patient has no IV contrast allergy and is agreeable to proceed with this test.  Sublingual nitroglycerin prescription was sent, its protocol and 911 protocol explained and the patient vocalized understanding questions were answered to the patient's satisfaction  Blood pressure is acceptable, continue with current antihypertensive regimen which includes lisinopril 20 mg daily.  Hyperlipidemia - continue with current statin medication.  Continue her vitamin D treatments per her PCP  The patient is in agreement with the above plan. The patient left the office in stable condition.  The patient will follow up in 3 months or sooner if needed.   Medication Adjustments/Labs and Tests Ordered: Current medicines are reviewed at length with the patient today.  Concerns regarding medicines are outlined above.  No orders of the defined types were placed in this encounter.  No orders of the defined types were placed in this encounter.   There are no Patient  Instructions on file for this visit.   Adopting a Healthy Lifestyle.  Know what a healthy weight is for you (roughly BMI <25) and aim to maintain this   Aim for 7+ servings of fruits and vegetables daily   65-80+ fluid ounces of water or unsweet tea for healthy kidneys   Limit to max 1 drink of alcohol per day; avoid smoking/tobacco   Limit animal fats in diet for cholesterol and heart health - choose grass fed whenever available   Avoid highly processed foods, and foods high in saturated/trans fats   Aim for low stress - take time to unwind and care for your mental health   Aim for 150 min of moderate intensity exercise weekly for heart health, and weights twice weekly for bone health   Aim for 7-9 hours of sleep daily   When it comes to diets, agreement about the perfect plan isnt easy to find, even among the experts. Experts at the [facility] [employer] developed an idea known as the Healthy  Eating Plate. Just imagine a plate divided into logical, healthy portions.   The emphasis is on diet quality:   Load up on vegetables and fruits - one-half of your plate: Aim for color and variety, and remember that potatoes dont count.   Go for whole grains - one-quarter of your plate: Whole wheat, barley, wheat berries, quinoa, oats, brown rice, and foods made with them. If you want pasta, go with whole wheat pasta.   Protein power - one-quarter of your plate: Fish, chicken, beans, and nuts are all healthy, versatile protein sources. Limit red meat.   The diet, however, does go beyond the plate, offering a few other suggestions.   Use healthy plant oils, such as olive, canola, soy, corn, sunflower and peanut. Check the labels, and avoid partially hydrogenated oil, which have unhealthy trans fats.   If youre thirsty, drink water. Coffee and tea are good in moderation, but skip sugary drinks and limit milk and dairy products to one or two daily servings.   The type of carbohydrate in the diet is more important than the amount. Some sources of carbohydrates, such as vegetables, fruits, whole grains, and beans-are healthier than others.   Finally, stay active  Signed, Thomasene Ripple, DO  01/05/2021 9:26 AM    [facility]

## 2021-01-06 LAB — MAGNESIUM: Magnesium: 2.2 mg/dL (ref 1.6–2.3)

## 2021-01-06 LAB — BASIC METABOLIC PANEL
BUN/Creatinine Ratio: 28 (ref 12–28)
BUN: 29 mg/dL — ABNORMAL HIGH (ref 8–27)
CO2: 20 mmol/L (ref 20–29)
Calcium: 9.5 mg/dL (ref 8.7–10.3)
Chloride: 105 mmol/L (ref 96–106)
Creatinine, Ser: 1.02 mg/dL — ABNORMAL HIGH (ref 0.57–1.00)
GFR calc Af Amer: 63 mL/min/{1.73_m2} (ref >59)
GFR calc non Af Amer: 55 mL/min/{1.73_m2} — ABNORMAL LOW (ref >59)
Glucose: 87 mg/dL (ref 65–99)
Potassium: 5.4 mmol/L — ABNORMAL HIGH (ref 3.5–5.2)
Sodium: 140 mmol/L (ref 134–144)

## 2021-01-11 ENCOUNTER — Telehealth (HOSPITAL_COMMUNITY): Payer: Self-pay | Admitting: Emergency Medicine

## 2021-01-11 NOTE — Telephone Encounter (Signed)
Reaching out to patient to offer assistance regarding upcoming cardiac imaging study; pt verbalizes understanding of appt date/time, parking situation and where to check in, pre-test NPO status and medications ordered, and verified current allergies; name and call back number provided for further questions should they arise Jaleeah Slight RN Navigator Cardiac Imaging [facility] [phone] office [phone] cell 

## 2021-01-13 ENCOUNTER — Ambulatory Visit (HOSPITAL_COMMUNITY)
Admission: RE | Admit: 2021-01-13 | Discharge: 2021-01-13 | Disposition: A | Discharge status: Home / Self Care | Payer: Federal, State, Local not specified - PPO | Source: Ambulatory Visit | Attending: Cardiology | Admitting: Cardiology

## 2021-01-13 ENCOUNTER — Encounter (HOSPITAL_COMMUNITY): Payer: Self-pay

## 2021-01-13 ENCOUNTER — Other Ambulatory Visit: Payer: Self-pay

## 2021-01-13 DIAGNOSIS — I2510 Atherosclerotic heart disease of native coronary artery without angina pectoris: Secondary | ICD-10-CM | POA: Diagnosis not present

## 2021-01-13 DIAGNOSIS — R0789 Other chest pain: Secondary | ICD-10-CM | POA: Diagnosis not present

## 2021-01-13 MED ORDER — NITROGLYCERIN 0.4 MG SL SUBL
0.8000 mg | SUBLINGUAL_TABLET | Freq: Once | SUBLINGUAL | Status: AC
  Administered 2021-01-13: 0.8 mg via SUBLINGUAL

## 2021-01-13 MED ORDER — NITROGLYCERIN 0.4 MG SL SUBL
SUBLINGUAL_TABLET | SUBLINGUAL | Status: AC
  Filled 2021-01-13: qty 2

## 2021-01-13 MED ORDER — METOPROLOL TARTRATE 5 MG/5ML IV SOLN: 5.0000 mg | INTRAVENOUS | Status: DC | PRN

## 2021-01-13 MED ORDER — IOHEXOL 350 MG/ML SOLN
80.0000 mL | Freq: Once | INTRAVENOUS | Status: AC | PRN
  Administered 2021-01-13: 80 mL via INTRAVENOUS

## 2021-01-14 ENCOUNTER — Ambulatory Visit (HOSPITAL_COMMUNITY)
Admission: RE | Admit: 2021-01-14 | Discharge: 2021-01-14 | Disposition: A | Discharge status: Home / Self Care | Payer: Federal, State, Local not specified - PPO | Source: Ambulatory Visit | Attending: Cardiology | Admitting: Cardiology

## 2021-01-14 DIAGNOSIS — R0789 Other chest pain: Secondary | ICD-10-CM

## 2021-01-15 DIAGNOSIS — I2510 Atherosclerotic heart disease of native coronary artery without angina pectoris: Secondary | ICD-10-CM | POA: Diagnosis not present

## 2021-01-16 ENCOUNTER — Telehealth: Payer: Self-pay | Admitting: Cardiology

## 2021-01-16 NOTE — Telephone Encounter (Signed)
Patient returning call to discuss CT results

## 2021-01-16 NOTE — Telephone Encounter (Signed)
Patient returning call for CT results. 

## 2021-01-19 ENCOUNTER — Telehealth: Payer: Self-pay

## 2021-01-19 NOTE — Telephone Encounter (Signed)
Patient is wanting to know more details about the results of her CT. She does not want to wait until her appt.

## 2021-01-19 NOTE — Telephone Encounter (Signed)
Spoke with patient about her results. She has an appointment at the end of the mont. Patient is eager to talk to someone about the results. Patient told Dr. Servando Salina will speak to her about the results when she comes to her appointment.

## 2021-01-23 NOTE — Telephone Encounter (Signed)
Patient called our office today wanting more details  on her imaging report and also she had concerns about lab work that was drawn at the office.

## 2021-01-23 NOTE — Telephone Encounter (Signed)
    Pt is calling back, she really would like to get more info about her CT result. She said she will come in to her appt but she needs to know the result and what food she needs to avoid

## 2021-01-24 NOTE — Telephone Encounter (Signed)
Spoke with patient about about diet modification. She states "I have been making changes already. I hope I'm doing the right thing." Patient encouraged to eat whole grains, fruits and vegetables. Patient asks about her CT results. She is made  aware Dr. Servando Salina will discuss her results in person on 3/23 at her appointment. Patient is very understanding at this time. No further questions or concerns expressed.

## 2021-01-28 ENCOUNTER — Other Ambulatory Visit: Payer: Self-pay | Admitting: Medical

## 2021-02-08 ENCOUNTER — Encounter: Payer: Self-pay | Admitting: Cardiology

## 2021-02-08 ENCOUNTER — Other Ambulatory Visit: Payer: Self-pay

## 2021-02-08 ENCOUNTER — Ambulatory Visit: Payer: Federal, State, Local not specified - PPO | Admitting: Cardiology

## 2021-02-08 VITALS — BP 130/80 | HR 70 | Ht 62.0 in | Wt 163.1 lb

## 2021-02-08 DIAGNOSIS — I10 Essential (primary) hypertension: Secondary | ICD-10-CM | POA: Diagnosis not present

## 2021-02-08 DIAGNOSIS — E785 Hyperlipidemia, unspecified: Secondary | ICD-10-CM | POA: Diagnosis not present

## 2021-02-08 DIAGNOSIS — I2510 Atherosclerotic heart disease of native coronary artery without angina pectoris: Secondary | ICD-10-CM

## 2021-02-08 MED ORDER — ASPIRIN EC 81 MG PO TBEC: 81.0000 mg | DELAYED_RELEASE_TABLET | Freq: Every day | ORAL | 3 refills | Status: AC

## 2021-02-08 MED ORDER — ISOSORBIDE MONONITRATE ER 30 MG PO TB24: 30.0000 mg | ORAL_TABLET | Freq: Every day | ORAL | 3 refills | Status: DC

## 2021-02-08 MED ORDER — ATORVASTATIN CALCIUM 40 MG PO TABS: 40.0000 mg | ORAL_TABLET | Freq: Every day | ORAL | 3 refills | Status: DC

## 2021-02-08 NOTE — Patient Instructions (Signed)
Medication Instructions:  Your physician has recommended you make the following change in your medication:  START: Aspirin 81 mg once daily START: Imdur 30 mg once daily INCREASE: Lipitor 40 mg once daily *If you need a refill on your cardiac medications before your next appointment, please call your pharmacy*   Lab Work: None If you have labs (blood work) drawn today and your tests are completely normal, you will receive your results only by: Marland Kitchen MyChart Message (if you have MyChart) OR . A paper copy in the mail If you have any lab test that is abnormal or we need to change your treatment, we will call you to review the results.   Testing/Procedures: None   Follow-Up: At [facility], you and your health needs are our priority.  As part of our continuing mission to provide you with exceptional heart care, we have created designated Provider Care Teams.  These Care Teams include your primary Cardiologist (physician) and Advanced Practice Providers (APPs -  Physician Assistants and Nurse Practitioners) who all work together to provide you with the care you need, when you need it.  We recommend signing up for the patient portal called "MyChart".  Sign up information is provided on this After Visit Summary.  MyChart is used to connect with patients for Virtual Visits (Telemedicine).  Patients are able to view lab/test results, encounter notes, upcoming appointments, etc.  Non-urgent messages can be sent to your provider as well.   To learn more about what you can do with MyChart, go to [email or web address].    Your next appointment:   3 month(s)  The format for your next appointment:   In Person  Provider:   Thomasene Ripple, DO   Other Instructions

## 2021-02-08 NOTE — Progress Notes (Signed)
Cardiology Office Note:    Date:  02/08/2021   ID:  Kayla Clark, DOB 04/25/1946, MRN [id number]  PCP:  Kayla Clark, Kayla Clark  Cardiologist:  Kayla RippleKardie Nakoma Gotwalt, DO  Electrophysiologist:  None   Referring MD: Kayla Clark, Kayla Clark   CC: I am doing fine just nervous  History of Present Illness:    Kayla Clark is a 75 y.o. female with a hx of coronary artery disease seen on coronary CTA, Hyperlipidemia, hypertension, vitamin D deficiency is here today for follow-up visit.  Did see the patient on January 06, 2020 at that time she has had some chest discomfort.  Given her risk factor we pursued a coronary CTA.  She is here today to discuss these results.  She tells me since I last saw her she had not had any chest discomfort.  She is nervous.  Past Medical History:  Diagnosis Date  . Esophageal reflux   . Gastro-esophageal reflux disease with esophagitis 06/23/2009   Formatting of this note might be different from the original. Chronic Reflux Esophagitis  . Hypertension    hx of borderline bp in past. Never was on meds.  . Osteoarthritis     Past Surgical History:  Procedure Laterality Date  . KNEE SURGERY     2 knee transplants    Current Medications: Current Meds  Medication Sig  . aspirin EC 81 MG tablet Take 1 tablet (81 mg total) by mouth daily. Swallow whole.  Marland Kitchen. atorvastatin (LIPITOR) 40 MG tablet Take 1 tablet (40 mg total) by mouth daily.  . fluticasone (FLONASE) 50 MCG/ACT nasal spray Place 2 sprays into both nostrils daily.  . isosorbide mononitrate (IMDUR) 30 MG 24 hr tablet Take 1 tablet (30 mg total) by mouth daily.  Marland Kitchen. lisinopril (ZESTRIL) 10 MG tablet Take 2 tablets (20 mg total) by mouth daily.  . metoprolol tartrate (LOPRESSOR) 100 MG tablet Take 2 hours prior to CT  . nitroGLYCERIN (NITROSTAT) 0.4 MG SL tablet Place 1 tablet (0.4 mg total) under the tongue every 5 (five) minutes as needed for chest pain. Up to 3 times  . sertraline (ZOLOFT) 50 MG tablet TAKE  1 TABLET BY MOUTH EVERY DAY  . Vitamin D, Ergocalciferol, (DRISDOL) 1.25 MG (50000 UNIT) CAPS capsule Take 1 capsule (50,000 Units total) by mouth every 7 (seven) days.  . [DISCONTINUED] atorvastatin (LIPITOR) 20 MG tablet TAKE 1 TABLET BY MOUTH EVERY DAY     Allergies:   Patient has no known allergies.   Social History   Socioeconomic History  . Marital status: Widowed    Spouse name: Not on file  . Number of children: Not on file  . Years of education: Not on file  . Highest education level: Not on file  Occupational History  . Not on file  Tobacco Use  . Smoking status: Never Smoker  . Smokeless tobacco: Never Used  Substance and Sexual Activity  . Alcohol use: No    Alcohol/week: 0.0 standard drinks  . Drug use: No  . Sexual activity: Not on file  Other Topics Concern  . Not on file  Social History Narrative  . Not on file   Social Determinants of Health   Financial Resource Strain: Not on file  Food Insecurity: Not on file  Transportation Needs: Not on file  Physical Activity: Not on file  Stress: Not on file  Social Connections: Not on file     Family History: The patient's family history includes Heart disease in her  father and mother.  ROS:   Review of Systems  Constitution: Negative for decreased appetite, fever and weight gain.  HENT: Negative for congestion, ear discharge, hoarse voice and sore throat.   Eyes: Negative for discharge, redness, vision loss in right eye and visual halos.  Cardiovascular: Negative for chest pain, dyspnea on exertion, leg swelling, orthopnea and palpitations.  Respiratory: Negative for cough, hemoptysis, shortness of breath and snoring.   Endocrine: Negative for heat intolerance and polyphagia.  Hematologic/Lymphatic: Negative for bleeding problem. Does not bruise/bleed easily.  Skin: Negative for flushing, nail changes, rash and suspicious lesions.  Musculoskeletal: Negative for arthritis, joint pain, muscle cramps,  myalgias, neck pain and stiffness.  Gastrointestinal: Negative for abdominal pain, bowel incontinence, diarrhea and excessive appetite.  Genitourinary: Negative for decreased libido, genital sores and incomplete emptying.  Neurological: Negative for brief paralysis, focal weakness, headaches and loss of balance.  Psychiatric/Behavioral: Negative for altered mental status, depression and suicidal ideas.  Allergic/Immunologic: Negative for HIV exposure and persistent infections.    EKGs/Labs/Other Studies Reviewed:    The following studies were reviewed today:   EKG: None today  Coronary CTA performed on January 14, 2021 FINDINGS: Image quality: Average  Noise artifact is: Misregistration due to cardiac motion.  Coronary calcium score is 285, which places the patient in the 81st percentile for age and sex matched control.  Coronary arteries: Normal coronary origins.  Right dominance.  Right Coronary Artery: Minimal scattered mixed atherosclerotic plaque, <25% stenosis. Possible calcified plaque at ostial PDA, difficult to assess degree of stenosis, though distal to this questionable area the vessel is normal caliber and patent.  Left Main Coronary Artery: Mild mixed atherosclerotic plaque in mid-distal left main coronary artery, 25-49% stenosis.  Left Anterior Descending Coronary Artery: Mild mixed atherosclerotic plaque in the ostial LAD, proximal LAD, and proximal first diagonal, 25-49% stenosis. Severe stenosis in the mid LAD with mixed atherosclerotic plaque, 70-99%.  Left Circumflex Artery: No detectable plaque or stenosis.  Aorta: Normal size, 33 mm at the sinus of Valsalva and mid ascending aorta (level of the PA bifurcation) measured double oblique. No calcifications. No dissection.  Aortic Valve: No calcifications.  Mildly thickened cusps.  Other findings:  Normal pulmonary vein drainage into the left atrium.  Normal left atrial appendage without  a thrombus.  Normal size of the pulmonary artery.  IMPRESSION: 1. Severe CAD in mid LAD, and mild stenosis in mid and distal left main coronary artery, CADRADS = 4. CT FFR will be performed and reported separately.  2. Coronary calcium score is 285, which places the patient in the 81st percentile for age and sex matched control.  3. Normal coronary origin with right dominance.  FFR CT performed on January 15, 2021 1. Left Main: FFR = 0.97  2. LAD: Proximal FFR = 0.90, mid FFR = 0.87, distal FFR = 0.79, D1 prox FFR = 0.90, D1 distal FFR = 0.83 3. LCX: Proximal FFR = 0.91, distal FFR = 0.83 4. RCA: Proximal FFR = 0.95, mid FFR =0.93, Distal FFR = 0.92, PDA FFR = 0.89  IMPRESSION: 1. CT FFR analysis showed no significant stenosis. Specifically, mid LAD lesion does not appear severely stenosed.  2. Indeterminate FFR value in distal LAD may represent vessel tapering/diffuse disease.  RECOMMENDATIONS: Guideline-directed medical therapy and aggressive risk factor modification for secondary prevention of coronary artery disease.  Recent Labs: 09/29/2020: TSH 1.74 12/28/2020: ALT 11 01/05/2021: BUN 29; Creatinine, Ser 1.02; Magnesium 2.2; Potassium 5.4; Sodium 140  Recent Lipid  Panel    Component Value Date/Time   CHOL 163 09/29/2020 0750   TRIG 80.0 09/29/2020 0750   HDL 59.00 09/29/2020 0750   CHOLHDL 3 09/29/2020 0750   VLDL 16.0 09/29/2020 0750   LDLCALC 88 09/29/2020 0750    Physical Exam:    VS:  BP 130/80   Pulse 70   Ht  (1.575 m)   Wt 163 lb 1.6 oz (74 kg)   SpO2 98%   BMI 29.83 kg/m     Wt Readings from Last 3 Encounters:  02/08/21 163 lb 1.6 oz (74 kg)  01/05/21 160 lb (72.6 kg)  12/28/20 159 lb (72.1 kg)     GEN: Well nourished, well developed in no acute distress HEENT: Normal NECK: No JVD; No carotid bruits LYMPHATICS: No lymphadenopathy CARDIAC: S1S2 noted,RRR, no murmurs, rubs, gallops RESPIRATORY:  Clear to auscultation without  rales, wheezing or rhonchi  ABDOMEN: Soft, non-tender, non-distended, +bowel sounds, no guarding. EXTREMITIES: No edema, No cyanosis, no clubbing MUSCULOSKELETAL:  No deformity  SKIN: Warm and dry NEUROLOGIC:  Alert and oriented x 3, non-focal PSYCHIATRIC:  Normal affect, good insight  ASSESSMENT:    1. Coronary artery disease involving native coronary artery of native heart, unspecified whether angina present   2. Hyperlipidemia, unspecified hyperlipidemia type   3. Hypertension, unspecified type    PLAN:     I explained to patient about her new diagnosis of coronary artery disease.  She is very nervous about this.  I explained to her what it means and what we need to do in the future.  She denies any symptoms at this time.  Starting the patient on aspirin 81 mg daily, I am going to increase her Lipitor to 40 mg daily and start her on antianginal Imdur 30 mg daily.  This optimal medical therapy hopefully will help and we will continue to monitor the patient closely.  For now she would prefer to be on optimal medical therapy as opposed to proceeding with any invasive procedures at this time.  Do not think this is unreasonable.  Her blood pressure is acceptable in the office today.    The patient is in agreement with the above plan. The patient left the office in stable condition.  The patient will follow up in 3 months or sooner if needed.   Medication Adjustments/Labs and Tests Ordered: Current medicines are reviewed at length with the patient today.  Concerns regarding medicines are outlined above.  No orders of the defined types were placed in this encounter.  Meds ordered this encounter  Medications  . aspirin EC 81 MG tablet    Sig: Take 1 tablet (81 mg total) by mouth daily. Swallow whole.    Dispense:  90 tablet    Refill:  3  . atorvastatin (LIPITOR) 40 MG tablet    Sig: Take 1 tablet (40 mg total) by mouth daily.    Dispense:  90 tablet    Refill:  3  . isosorbide  mononitrate (IMDUR) 30 MG 24 hr tablet    Sig: Take 1 tablet (30 mg total) by mouth daily.    Dispense:  90 tablet    Refill:  3    Patient Instructions  Medication Instructions:  Your physician has recommended you make the following change in your medication:  START: Aspirin 81 mg once daily START: Imdur 30 mg once daily INCREASE: Lipitor 40 mg once daily *If you need a refill on your cardiac medications before your next appointment,  please call your pharmacy*   Lab Work: None If you have labs (blood work) drawn today and your tests are completely normal, you will receive your results only by: Marland Kitchen MyChart Message (if you have MyChart) OR . A paper copy in the mail If you have any lab test that is abnormal or we need to change your treatment, we will call you to review the results.   Testing/Procedures: None   Follow-Up: At [facility], you and your health needs are our priority.  As part of our continuing mission to provide you with exceptional heart care, we have created designated Provider Care Teams.  These Care Teams include your primary Cardiologist (physician) and Advanced Practice Providers (APPs -  Physician Assistants and Nurse Practitioners) who all work together to provide you with the care you need, when you need it.  We recommend signing up for the patient portal called "MyChart".  Sign up information is provided on this After Visit Summary.  MyChart is used to connect with patients for Virtual Visits (Telemedicine).  Patients are able to view lab/test results, encounter notes, upcoming appointments, etc.  Non-urgent messages can be sent to your provider as well.   To learn more about what you can do with MyChart, go to [email or web address].    Your next appointment:   3 month(s)  The format for your next appointment:   In Person  Provider:   Thomasene Ripple, DO   Other Instructions      Adopting a Healthy Lifestyle.  Know what a healthy weight is for  you (roughly BMI <25) and aim to maintain this   Aim for 7+ servings of fruits and vegetables daily   65-80+ fluid ounces of water or unsweet tea for healthy kidneys   Limit to max 1 drink of alcohol per day; avoid smoking/tobacco   Limit animal fats in diet for cholesterol and heart health - choose grass fed whenever available   Avoid highly processed foods, and foods high in saturated/trans fats   Aim for low stress - take time to unwind and care for your mental health   Aim for 150 min of moderate intensity exercise weekly for heart health, and weights twice weekly for bone health   Aim for 7-9 hours of sleep daily   When it comes to diets, agreement about the perfect plan isnt easy to find, even among the experts. Experts at the [facility] of [employer] developed an idea known as the Healthy Eating Plate. Just imagine a plate divided into logical, healthy portions.   The emphasis is on diet quality:   Load up on vegetables and fruits - one-half of your plate: Aim for color and variety, and remember that potatoes dont count.   Go for whole grains - one-quarter of your plate: Whole wheat, barley, wheat berries, quinoa, oats, brown rice, and foods made with them. If you want pasta, go with whole wheat pasta.   Protein power - one-quarter of your plate: Fish, chicken, beans, and nuts are all healthy, versatile protein sources. Limit red meat.   The diet, however, does go beyond the plate, offering a few other suggestions.   Use healthy plant oils, such as olive, canola, soy, corn, sunflower and peanut. Check the labels, and avoid partially hydrogenated oil, which have unhealthy trans fats.   If youre thirsty, drink water. Coffee and tea are good in moderation, but skip sugary drinks and limit milk and dairy products to one or two daily servings.  The type of carbohydrate in the diet is more important than the amount. Some sources of carbohydrates, such as vegetables,  fruits, whole grains, and beans-are healthier than others.   Finally, stay active  Signed, Kayla Ripple, DO  02/08/2021 10:55 AM     [facility]

## 2021-03-09 ENCOUNTER — Telehealth: Payer: Self-pay | Admitting: Medical

## 2021-03-09 MED ORDER — LISINOPRIL 10 MG PO TABS: 20.0000 mg | ORAL_TABLET | Freq: Every day | ORAL | 1 refills | Status: DC

## 2021-03-09 NOTE — Telephone Encounter (Signed)
Medication:lisinopril (ZESTRIL) 10 MG tablet [[id number]]       Has the patient contacted their pharmacy? no (If no, request that the patient contact the pharmacy for the refill.) (If yes, when and what did the pharmacy advise?)    Preferred Pharmacy (with phone number or street name): [facility] [phone] - [place], [place] - [address] Phone:  [phone]  Fax:  [phone]          Agent: Please be advised that RX refills may take up to 3 business days. We ask that you follow-up with your pharmacy.

## 2021-03-09 NOTE — Telephone Encounter (Signed)
Rx sent 

## 2021-03-26 ENCOUNTER — Other Ambulatory Visit: Payer: Self-pay | Admitting: Cardiology

## 2021-04-14 ENCOUNTER — Encounter: Payer: Self-pay | Admitting: Cardiology

## 2021-04-29 ENCOUNTER — Other Ambulatory Visit: Payer: Self-pay | Admitting: Medical

## 2021-05-17 ENCOUNTER — Encounter: Payer: Self-pay | Admitting: Cardiology

## 2021-05-17 ENCOUNTER — Other Ambulatory Visit: Payer: Self-pay

## 2021-05-17 ENCOUNTER — Ambulatory Visit: Payer: Federal, State, Local not specified - PPO | Admitting: Cardiology

## 2021-05-17 VITALS — BP 140/82 | HR 70 | Ht 62.0 in | Wt 159.0 lb

## 2021-05-17 DIAGNOSIS — I10 Essential (primary) hypertension: Secondary | ICD-10-CM | POA: Diagnosis not present

## 2021-05-17 DIAGNOSIS — E782 Mixed hyperlipidemia: Secondary | ICD-10-CM

## 2021-05-17 DIAGNOSIS — I2510 Atherosclerotic heart disease of native coronary artery without angina pectoris: Secondary | ICD-10-CM | POA: Diagnosis not present

## 2021-05-17 HISTORY — DX: Mixed hyperlipidemia: E78.2

## 2021-05-17 NOTE — Patient Instructions (Signed)
Medication Instructions:  Your physician recommends that you continue on your current medications as directed. Please refer to the Current Medication list given to you today.  *If you need a refill on your cardiac medications before your next appointment, please call your pharmacy*   Lab Work: None If you have labs (blood work) drawn today and your tests are completely normal, you will receive your results only by: MyChart Message (if you have MyChart) OR A paper copy in the mail If you have any lab test that is abnormal or we need to change your treatment, we will call you to review the results.   Testing/Procedures: None   Follow-Up: At [facility], you and your health needs are our priority.  As part of our continuing mission to provide you with exceptional heart care, we have created designated Provider Care Teams.  These Care Teams include your primary Cardiologist (physician) and Advanced Practice Providers (APPs -  Physician Assistants and Nurse Practitioners) who all work together to provide you with the care you need, when you need it.  We recommend signing up for the patient portal called "MyChart".  Sign up information is provided on this After Visit Summary.  MyChart is used to connect with patients for Virtual Visits (Telemedicine).  Patients are able to view lab/test results, encounter notes, upcoming appointments, etc.  Non-urgent messages can be sent to your provider as well.   To learn more about what you can do with MyChart, go to [email or web address].    Your next appointment:   1 year(s)  The format for your next appointment:   In Person    Other Instructions   

## 2021-05-17 NOTE — Progress Notes (Signed)
Cardiology Office Note:    Date:  05/17/2021   ID:  Kayla Clark, DOB 04/29/1946, MRN [id number]  PCP:  Kayla Clark, Edward, PA-C  Cardiologist:  Kayla RippleKardie Delaney Perona, DO  Electrophysiologist:  None   Referring MD: Kayla Clark, Edward, PA-C   No chief complaint on file. I am doing fine  History of Present Illness:    Kayla Clark is a 75 y.o. female with a hx of coronary artery disease seen on coronary CTA, Hyperlipidemia, hypertension, vitamin D deficiency is here today for follow-up visit.  I did see the patient on January 06, 2020 at that time she has had some chest discomfort.  Given her risk factor we pursued a coronary CTA.  I saw the patient on February 08, 2021 at that time we discussed her coronary CTA results.  We started patient on optimal medical therapy including antianginals.  She is here today for follow-up visit.  She tells me she has been doing well on her current medical therapy.  No chest pain.  Past Medical History:  Diagnosis Date   Esophageal reflux    Gastro-esophageal reflux disease with esophagitis 06/23/2009   Formatting of this note might be different from the original. Chronic Reflux Esophagitis   Hypertension    hx of borderline bp in past. Never was on meds.   Mixed hyperlipidemia 05/17/2021   Osteoarthritis     Past Surgical History:  Procedure Laterality Date   KNEE SURGERY     2 knee transplants    Current Medications: Current Meds  Medication Sig   aspirin EC 81 MG tablet Take 1 tablet (81 mg total) by mouth daily. Swallow whole.   atorvastatin (LIPITOR) 40 MG tablet Take 1 tablet (40 mg total) by mouth daily.   COVID-19 mRNA vaccine, Pfizer, 30 MCG/0.3ML injection INJECT AS DIRECTED   fluticasone (FLONASE) 50 MCG/ACT nasal spray Place 2 sprays into both nostrils daily.   lisinopril (ZESTRIL) 10 MG tablet Take 2 tablets (20 mg total) by mouth daily.   nitroGLYCERIN (NITROSTAT) 0.4 MG SL tablet PLACE 1 TABLET UNDER THE TONGUE EVERY 5 (FIVE)  MINUTES AS NEEDED FOR CHEST PAIN. UP TO 3 TIMES   sertraline (ZOLOFT) 50 MG tablet TAKE 1 TABLET BY MOUTH EVERY DAY   Vitamin D, Ergocalciferol, (DRISDOL) 1.25 MG (50000 UNIT) CAPS capsule Take 1 capsule (50,000 Units total) by mouth every 7 (seven) days.   [DISCONTINUED] metoprolol tartrate (LOPRESSOR) 100 MG tablet Take 2 hours prior to CT     Allergies:   Patient has no known allergies.   Social History   Socioeconomic History   Marital status: Widowed    Spouse name: Not on file   Number of children: Not on file   Years of education: Not on file   Highest education level: Not on file  Occupational History   Not on file  Tobacco Use   Smoking status: Never   Smokeless tobacco: Never  Substance and Sexual Activity   Alcohol use: No    Alcohol/week: 0.0 standard drinks   Drug use: No   Sexual activity: Not on file  Other Topics Concern   Not on file  Social History Narrative   ** Merged History Encounter **       Social Determinants of Health   Financial Resource Strain: Not on file  Food Insecurity: Not on file  Transportation Needs: Not on file  Physical Activity: Not on file  Stress: Not on file  Social Connections: Not on file  Family History: The patient's family history includes Heart disease in her father and mother.  ROS:   Review of Systems  Constitution: Negative for decreased appetite, fever and weight gain.  HENT: Negative for congestion, ear discharge, hoarse voice and sore throat.   Eyes: Negative for discharge, redness, vision loss in right eye and visual halos.  Cardiovascular: Negative for chest pain, dyspnea on exertion, leg swelling, orthopnea and palpitations.  Respiratory: Negative for cough, hemoptysis, shortness of breath and snoring.   Endocrine: Negative for heat intolerance and polyphagia.  Hematologic/Lymphatic: Negative for bleeding problem. Does not bruise/bleed easily.  Skin: Negative for flushing, nail changes, rash and  suspicious lesions.  Musculoskeletal: Negative for arthritis, joint pain, muscle cramps, myalgias, neck pain and stiffness.  Gastrointestinal: Negative for abdominal pain, bowel incontinence, diarrhea and excessive appetite.  Genitourinary: Negative for decreased libido, genital sores and incomplete emptying.  Neurological: Negative for brief paralysis, focal weakness, headaches and loss of balance.  Psychiatric/Behavioral: Negative for altered mental status, depression and suicidal ideas.  Allergic/Immunologic: Negative for HIV exposure and persistent infections.    EKGs/Labs/Other Studies Reviewed:    The following studies were reviewed today:   EKG:  None today  Coronary CTA performed on January 14, 2021 FINDINGS: Image quality: Average   Noise artifact is: Misregistration due to cardiac motion.   Coronary calcium score is 285, which places the patient in the 81st percentile for age and sex matched control.   Coronary arteries: Normal coronary origins.  Right dominance.   Right Coronary Artery: Minimal scattered mixed atherosclerotic plaque, <25% stenosis. Possible calcified plaque at ostial PDA, difficult to assess degree of stenosis, though distal to this questionable area the vessel is normal caliber and patent.   Left Main Coronary Artery: Mild mixed atherosclerotic plaque in mid-distal left main coronary artery, 25-49% stenosis.   Left Anterior Descending Coronary Artery: Mild mixed atherosclerotic plaque in the ostial LAD, proximal LAD, and proximal first diagonal, 25-49% stenosis. Severe stenosis in the mid LAD with mixed atherosclerotic plaque, 70-99%.   Left Circumflex Artery: No detectable plaque or stenosis.   Aorta: Normal size, 33 mm at the sinus of Valsalva and mid ascending aorta (level of the PA bifurcation) measured double oblique. No calcifications. No dissection.   Aortic Valve: No calcifications.  Mildly thickened cusps.   Other findings:    Normal pulmonary vein drainage into the left atrium.   Normal left atrial appendage without a thrombus.   Normal size of the pulmonary artery.   IMPRESSION: 1. Severe CAD in mid LAD, and mild stenosis in mid and distal left main coronary artery, CADRADS = 4. CT FFR will be performed and reported separately.   2. Coronary calcium score is 285, which places the patient in the 81st percentile for age and sex matched control.   3. Normal coronary origin with right dominance.   FFR CT performed on January 15, 2021 1. Left Main: FFR = 0.97   2. LAD: Proximal FFR = 0.90, mid FFR = 0.87, distal FFR = 0.79, D1 prox FFR = 0.90, D1 distal FFR = 0.83 3. LCX: Proximal FFR = 0.91, distal FFR = 0.83 4. RCA: Proximal FFR = 0.95, mid FFR =0.93, Distal FFR = 0.92, PDA FFR = 0.89   IMPRESSION: 1. CT FFR analysis showed no significant stenosis. Specifically, mid LAD lesion does not appear severely stenosed.   2. Indeterminate FFR value in distal LAD may represent vessel tapering/diffuse disease.   RECOMMENDATIONS: Guideline-directed medical therapy and aggressive risk  factor modification for secondary prevention of coronary artery disease.  Recent Labs: 09/29/2020: TSH 1.74 12/28/2020: ALT 11 01/05/2021: BUN 29; Creatinine, Ser 1.02; Magnesium 2.2; Potassium 5.4; Sodium 140  Recent Lipid Panel    Component Value Date/Time   CHOL 163 09/29/2020 0750   TRIG 80.0 09/29/2020 0750   HDL 59.00 09/29/2020 0750   CHOLHDL 3 09/29/2020 0750   VLDL 16.0 09/29/2020 0750   LDLCALC 88 09/29/2020 0750    Physical Exam:    VS:  BP 140/82   Pulse 70   Ht 5\' 2"  (1.575 m)   Wt 159 lb (72.1 kg)   SpO2 99%   BMI 29.08 kg/m     Wt Readings from Last 3 Encounters:  05/17/21 159 lb (72.1 kg)  02/08/21 163 lb 1.6 oz (74 kg)  01/05/21 160 lb (72.6 kg)     GEN: Well nourished, well developed in no acute distress HEENT: Normal NECK: No JVD; No carotid bruits LYMPHATICS: No  lymphadenopathy CARDIAC: S1S2 noted,RRR, no murmurs, rubs, gallops RESPIRATORY:  Clear to auscultation without rales, wheezing or rhonchi  ABDOMEN: Soft, non-tender, non-distended, +bowel sounds, no guarding. EXTREMITIES: No edema, No cyanosis, no clubbing MUSCULOSKELETAL:  No deformity  SKIN: Warm and dry NEUROLOGIC:  Alert and oriented x 3, non-focal PSYCHIATRIC:  Normal affect, good insight  ASSESSMENT:    1. Coronary artery disease involving native coronary artery of native heart, unspecified whether angina present   2. Hypertension, unspecified type   3. Mixed hyperlipidemia    PLAN:     1.  No anginal symptoms.  Continue her current medication therapy which includes aspirin 81 mg daily, atorvastatin 40 mg daily, Imdur 30 mg daily. 2.  Blood pressure is acceptable, continue with current antihypertensive regimen.  The patient is in agreement with the above plan. The patient left the office in stable condition.  The patient will follow up in 1 year or sooner if needed.   Medication Adjustments/Labs and Tests Ordered: Current medicines are reviewed at length with the patient today.  Concerns regarding medicines are outlined above.  No orders of the defined types were placed in this encounter.  No orders of the defined types were placed in this encounter.   Patient Instructions  Medication Instructions:  Your physician recommends that you continue on your current medications as directed. Please refer to the Current Medication list given to you today.  *If you need a refill on your cardiac medications before your next appointment, please call your pharmacy*   Lab Work: None If you have labs (blood work) drawn today and your tests are completely normal, you will receive your results only by: MyChart Message (if you have MyChart) OR A paper copy in the mail If you have any lab test that is abnormal or we need to change your treatment, we will call you to review the  results.   Testing/Procedures: None   Follow-Up: At [facility], you and your health needs are our priority.  As part of our continuing mission to provide you with exceptional heart care, we have created designated Provider Care Teams.  These Care Teams include your primary Cardiologist (physician) and Advanced Practice Providers (APPs -  Physician Assistants and Nurse Practitioners) who all work together to provide you with the care you need, when you need it.  We recommend signing up for the patient portal called "MyChart".  Sign up information is provided on this After Visit Summary.  MyChart is used to connect with patients for  Virtual Visits (Telemedicine).  Patients are able to view lab/test results, encounter notes, upcoming appointments, etc.  Non-urgent messages can be sent to your provider as well.   To learn more about what you can do with MyChart, go to [email or web address].    Your next appointment:   1 year(s)  The format for your next appointment:   In Person    Other Instructions   Adopting a Healthy Lifestyle.  Know what a healthy weight is for you (roughly BMI <25) and aim to maintain this   Aim for 7+ servings of fruits and vegetables daily   65-80+ fluid ounces of water or unsweet tea for healthy kidneys   Limit to max 1 drink of alcohol per day; avoid smoking/tobacco   Limit animal fats in diet for cholesterol and heart health - choose grass fed whenever available   Avoid highly processed foods, and foods high in saturated/trans fats   Aim for low stress - take time to unwind and care for your mental health   Aim for 150 min of moderate intensity exercise weekly for heart health, and weights twice weekly for bone health   Aim for 7-9 hours of sleep daily   When it comes to diets, agreement about the perfect plan isnt easy to find, even among the experts. Experts at the [facility] [employer] developed an idea known as the Healthy Eating  Plate. Just imagine a plate divided into logical, healthy portions.   The emphasis is on diet quality:   Load up on vegetables and fruits - one-half of your plate: Aim for color and variety, and remember that potatoes dont count.   Go for whole grains - one-quarter of your plate: Whole wheat, barley, wheat berries, quinoa, oats, brown rice, and foods made with them. If you want pasta, go with whole wheat pasta.   Protein power - one-quarter of your plate: Fish, chicken, beans, and nuts are all healthy, versatile protein sources. Limit red meat.   The diet, however, does go beyond the plate, offering a few other suggestions.   Use healthy plant oils, such as olive, canola, soy, corn, sunflower and peanut. Check the labels, and avoid partially hydrogenated oil, which have unhealthy trans fats.   If youre thirsty, drink water. Coffee and tea are good in moderation, but skip sugary drinks and limit milk and dairy products to one or two daily servings.   The type of carbohydrate in the diet is more important than the amount. Some sources of carbohydrates, such as vegetables, fruits, whole grains, and beans-are healthier than others.   Finally, stay active  Signed, Kayla Ripple, DO  05/17/2021 12:02 PM    [facility]

## 2021-06-12 ENCOUNTER — Other Ambulatory Visit: Payer: Self-pay | Admitting: Medical

## 2021-07-21 ENCOUNTER — Other Ambulatory Visit: Payer: Self-pay | Admitting: Medical

## 2021-10-15 ENCOUNTER — Other Ambulatory Visit: Payer: Self-pay | Admitting: Medical

## 2021-10-16 ENCOUNTER — Other Ambulatory Visit: Payer: Self-pay | Admitting: Medical

## 2021-10-23 ENCOUNTER — Encounter: Payer: Self-pay | Admitting: Family Medicine

## 2021-10-23 ENCOUNTER — Ambulatory Visit: Payer: Federal, State, Local not specified - PPO | Admitting: Family Medicine

## 2021-10-23 VITALS — BP 122/80 | HR 79 | Temp 98.0°F | Resp 18 | Ht 62.0 in | Wt 150.6 lb

## 2021-10-23 DIAGNOSIS — Z23 Encounter for immunization: Secondary | ICD-10-CM

## 2021-10-23 DIAGNOSIS — U071 COVID-19: Secondary | ICD-10-CM | POA: Diagnosis not present

## 2021-10-23 NOTE — Progress Notes (Signed)
[facility] [address], [address] [place], [place] [address] [phone]  Date:  10/23/2021   Name:  Kayla Clark   DOB:  03-21-1946   MRN:  [id number]  PCP:  Esperanza Richters, PA-C    Chief Complaint: post covid (Pt just wants to make sure she is okay to go back to work. She delivers mail. )   History of Present Illness:  Kayla Clark is a 75 y.o. very pleasant female patient who presents with the following:  Pt seen today for follow-up after covid 10  History of CAD, HTN Seen  by cardiology in June   She first noted sx the day after thanksgiving- she was exposed to an ill family member on Thanksgiving day, did not realize they had COVID until they spent the day together.  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 and hopes to retire in the next year or so   She is feeling about back to normal Still a bit congested but no significant symptoms  Patient Active Problem List   Diagnosis Date Noted   Mixed hyperlipidemia 05/17/2021   Coronary artery disease involving native coronary artery of native heart 05/17/2021   Hypertension    Wellness examination 08/09/2015   Hypokalemia 04/21/2015   HTN (hypertension) 04/07/2015   Tachycardia 04/07/2015   Pain in joint, pelvic region and thigh 04/07/2015   Esophageal reflux 12/13/2010   Gastro-esophageal reflux disease with esophagitis 06/23/2009   Osteoarthritis 06/23/2009   Pain in joint involving lower leg 06/23/2009    Past Medical History:  Diagnosis Date   Esophageal reflux    Gastro-esophageal reflux disease with esophagitis 06/23/2009   Formatting of  this note might be different from the original. Chronic Reflux Esophagitis   Hypertension    hx of borderline bp in past. Never was on meds.   Mixed hyperlipidemia 05/17/2021   Osteoarthritis     Past Surgical History:  Procedure Laterality Date   KNEE SURGERY     2 knee transplants    Social History   Tobacco Use   Smoking status: Never   Smokeless tobacco: Never  Substance Use Topics   Alcohol use: No    Alcohol/week: 0.0 standard drinks   Drug use: No    Family History  Problem Relation Age of Onset   Heart disease Mother    Heart disease Father     No Known Allergies  Medication list has been reviewed and updated.  Current Outpatient Medications on File Prior to Visit  Medication Sig Dispense Refill   aspirin EC 81 MG tablet Take 1 tablet (81 mg total) by mouth daily. Swallow whole. 90 tablet 3   fluticasone (FLONASE) 50 MCG/ACT nasal spray Place 2 sprays into both nostrils daily. 16 g 0   lisinopril (ZESTRIL) 10 MG tablet TAKE 2 TABLETS BY MOUTH EVERY DAY 180 tablet 1   nitroGLYCERIN (NITROSTAT) 0.4 MG SL tablet PLACE 1 TABLET UNDER THE TONGUE EVERY 5 (FIVE) MINUTES AS NEEDED FOR CHEST PAIN. UP TO 3 TIMES 25 tablet 9   sertraline (ZOLOFT)  50 MG tablet TAKE 1 TABLET BY MOUTH EVERY DAY 90 tablet 0   Vitamin D, Ergocalciferol, (DRISDOL) 1.25 MG (50000 UNIT) CAPS capsule Take 1 capsule (50,000 Units total) by mouth every 7 (seven) days. 8 capsule 0   atorvastatin (LIPITOR) 40 MG tablet Take 1 tablet (40 mg total) by mouth daily. 90 tablet 3   isosorbide mononitrate (IMDUR) 30 MG 24 hr tablet Take 1 tablet (30 mg total) by mouth daily. 90 tablet 3   No current facility-administered medications on file prior to visit.    Review of Systems:  As per HPI- otherwise negative.   Physical Examination: Vitals:   10/23/21 1424  BP: 122/80  Pulse: 79  Resp: 18  Temp: 98 F (36.7 C)  SpO2: 98%   Vitals:   10/23/21 1424  Weight: 150 lb 9.6 oz (68.3 kg)  Height: 5'  2" (1.575 m)   Body mass index is 27.55 kg/m. Ideal Body Weight: Weight in (lb) to have BMI = 25: 136.4  GEN: no acute distress.  Looks well Bilateral TM wnl, oropharynx normal.  PEERL,EOMI.   HEENT: Atraumatic, Normocephalic.  Ears and Nose: No external deformity. CV: RRR, No M/G/R. No JVD. No thrill. No extra heart sounds. PULM: CTA B, no wheezes, crackles, rhonchi. No retractions. No resp. distress. No accessory muscle use. EXTR: No c/c/e PSYCH: Normally interactive. Conversant.    Assessment and Plan: COVID-19  Need for influenza vaccination - Plan: Flu Vaccine QUAD High Dose(Fluad)  Patient is now 10 days out from start of COVID-19 symptoms, she may return to work with no restrictions  Flu shot given today  Signed Abbe Amsterdam, MD

## 2021-11-11 ENCOUNTER — Other Ambulatory Visit: Payer: Self-pay | Admitting: Medical

## 2021-11-21 DIAGNOSIS — S61214A Laceration without foreign body of right ring finger without damage to nail, initial encounter: Secondary | ICD-10-CM | POA: Diagnosis not present

## 2021-11-21 DIAGNOSIS — X58XXXA Exposure to other specified factors, initial encounter: Secondary | ICD-10-CM | POA: Diagnosis not present

## 2022-01-27 ENCOUNTER — Other Ambulatory Visit: Payer: Self-pay | Admitting: Cardiology

## 2022-05-05 ENCOUNTER — Other Ambulatory Visit: Payer: Self-pay | Admitting: Medical

## 2022-05-05 ENCOUNTER — Other Ambulatory Visit: Payer: Self-pay | Admitting: Cardiology

## 2022-05-07 NOTE — Telephone Encounter (Signed)
Rx refill sent to pharmacy. 

## 2022-05-30 ENCOUNTER — Other Ambulatory Visit: Payer: Self-pay | Admitting: Cardiology

## 2022-06-27 ENCOUNTER — Other Ambulatory Visit: Payer: Self-pay | Admitting: Cardiology

## 2022-07-21 ENCOUNTER — Other Ambulatory Visit: Payer: Self-pay | Admitting: Cardiology

## 2022-08-02 ENCOUNTER — Other Ambulatory Visit: Payer: Self-pay | Admitting: Medical

## 2022-08-02 ENCOUNTER — Other Ambulatory Visit: Payer: Self-pay | Admitting: Cardiology

## 2022-08-27 ENCOUNTER — Other Ambulatory Visit: Payer: Self-pay | Admitting: Medical

## 2022-08-30 ENCOUNTER — Other Ambulatory Visit: Payer: Self-pay | Admitting: Cardiology

## 2022-08-30 ENCOUNTER — Other Ambulatory Visit: Payer: Self-pay | Admitting: Medical

## 2022-09-13 ENCOUNTER — Other Ambulatory Visit: Payer: Self-pay | Admitting: Registered Nurse

## 2022-09-13 ENCOUNTER — Ambulatory Visit (INDEPENDENT_AMBULATORY_CARE_PROVIDER_SITE_OTHER): Payer: Self-pay

## 2022-09-13 DIAGNOSIS — R52 Pain, unspecified: Secondary | ICD-10-CM

## 2022-09-13 DIAGNOSIS — S3993XA Unspecified injury of pelvis, initial encounter: Secondary | ICD-10-CM | POA: Diagnosis not present

## 2022-09-13 DIAGNOSIS — W19XXXA Unspecified fall, initial encounter: Secondary | ICD-10-CM

## 2022-09-13 DIAGNOSIS — M533 Sacrococcygeal disorders, not elsewhere classified: Secondary | ICD-10-CM

## 2022-09-19 ENCOUNTER — Other Ambulatory Visit: Payer: Self-pay | Admitting: Medical

## 2022-09-19 DIAGNOSIS — H2513 Age-related nuclear cataract, bilateral: Secondary | ICD-10-CM | POA: Diagnosis not present

## 2022-09-19 DIAGNOSIS — H25013 Cortical age-related cataract, bilateral: Secondary | ICD-10-CM | POA: Diagnosis not present

## 2022-09-19 DIAGNOSIS — H25043 Posterior subcapsular polar age-related cataract, bilateral: Secondary | ICD-10-CM | POA: Diagnosis not present

## 2022-09-26 DIAGNOSIS — M545 Low back pain, unspecified: Secondary | ICD-10-CM | POA: Diagnosis not present

## 2022-09-26 DIAGNOSIS — M160 Bilateral primary osteoarthritis of hip: Secondary | ICD-10-CM | POA: Diagnosis not present

## 2022-10-23 ENCOUNTER — Other Ambulatory Visit: Payer: Self-pay | Admitting: Cardiology

## 2022-10-24 ENCOUNTER — Other Ambulatory Visit: Payer: Self-pay | Admitting: Medical

## 2022-10-30 ENCOUNTER — Other Ambulatory Visit: Payer: Self-pay | Admitting: Medical

## 2022-12-19 DIAGNOSIS — H25813 Combined forms of age-related cataract, bilateral: Secondary | ICD-10-CM | POA: Diagnosis not present

## 2023-01-01 ENCOUNTER — Encounter: Payer: Federal, State, Local not specified - PPO | Admitting: Medical

## 2023-01-01 DIAGNOSIS — H25813 Combined forms of age-related cataract, bilateral: Secondary | ICD-10-CM | POA: Diagnosis not present

## 2023-01-02 IMAGING — CT CT HEART MORP W/ CTA COR W/ SCORE W/ CA W/CM &/OR W/O CM
4 of 7 series · 8 of 20 positions shown, 9 images · IV contrast (APPLIED)
Comparison: 09/27/2020 chest radiograph.
COMPARISON: 09/27/2020 chest radiograph.

Addendum:
EXAM:
OVER-READ INTERPRETATION  CT CHEST

The following report is an over-read performed by radiologist Dr.
Guttila Parlina [[email or web address]] on 01/13/2021. This over-read
does not include interpretation of cardiac or coronary anatomy or
pathology. The coronary CTA interpretation by the cardiologist is
attached.
HISTORY: Chest pain/anginal equiv, ECGs and troponins normal
Cardiac/Coronary  CT
TECHNIQUE: The patient was scanned on a Siemens Force scanner.
PROTOCOL: A 120 kV prospective scan was triggered in the descending thoracic
aorta at 111 HU's. Axial non-contrast 3 mm slices were carried out
through the heart. The data set was analyzed on a dedicated work
station and scored using the Agatston method. Gantry rotation speed
was 250 msecs and collimation was .6 mm. Beta blockade and 0.8 mg of
sl NTG was given. The 3D data set was reconstructed in 5% intervals
of the 35-75 % of the R-R cycle. Systolic and diastolic phases were
analyzed on a dedicated work station using MPR, MIP and VRT modes.
The patient received 80mL OMNIPAQUE IOHEXOL 350 MG/ML SOLN contrast.

[Series 6: best diast 71 % · axial · 0.39mm/px · z∈[+1187,+1231]mm · 2 of 328 slices shown]
[im 110/328  vessel]
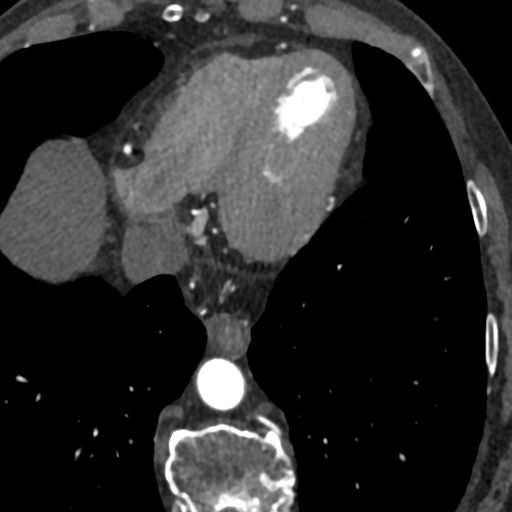
[im 219/328  vessel]
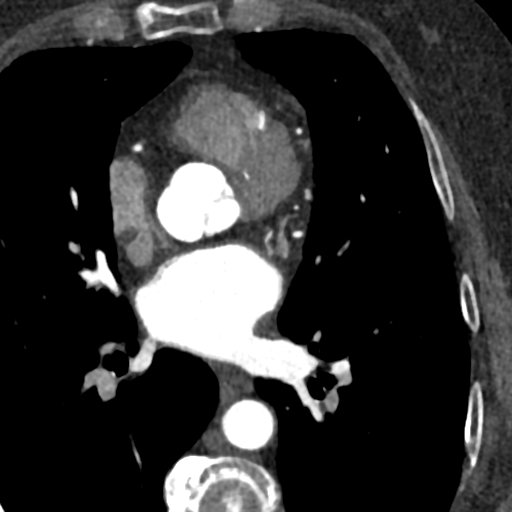

[Series 7: best syst · axial · 0.39mm/px · z∈[+1187,+1231]mm · 2 of 328 slices shown, 3 images]
[im 110/328  vessel]
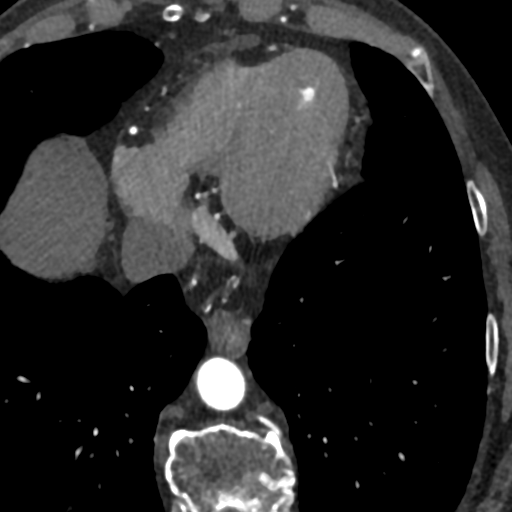
[im 110/328  lung]
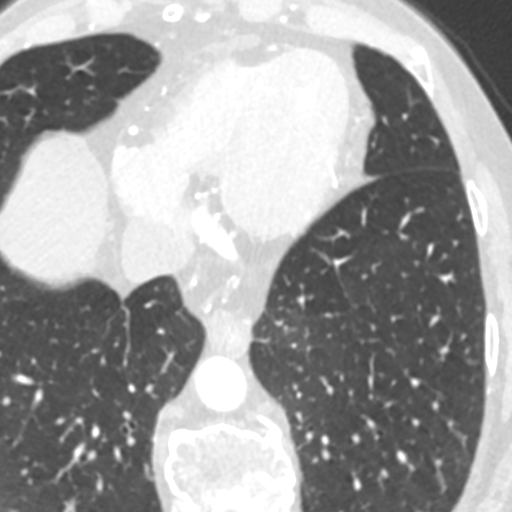
[im 219/328  vessel]
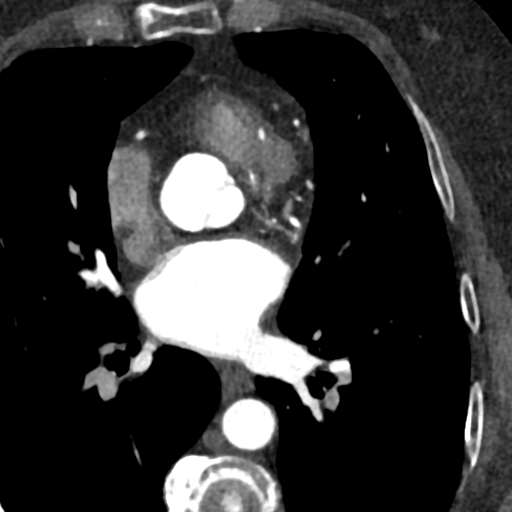

[Series 8: ts diast sharp 71 % · axial · 0.39mm/px · z∈[+1187,+1231]mm · 2 of 328 slices shown]
[im 110/328  lung]
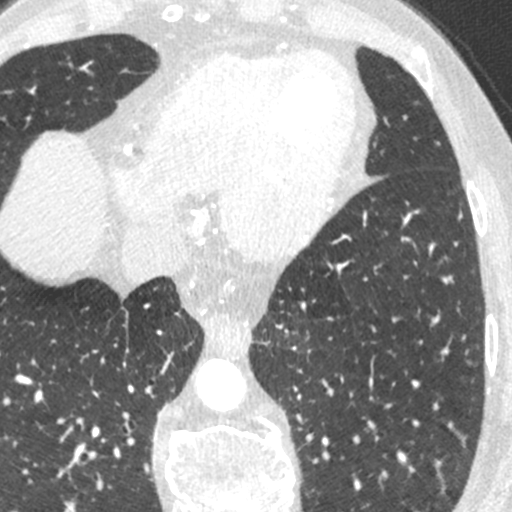
[im 219/328  lung]
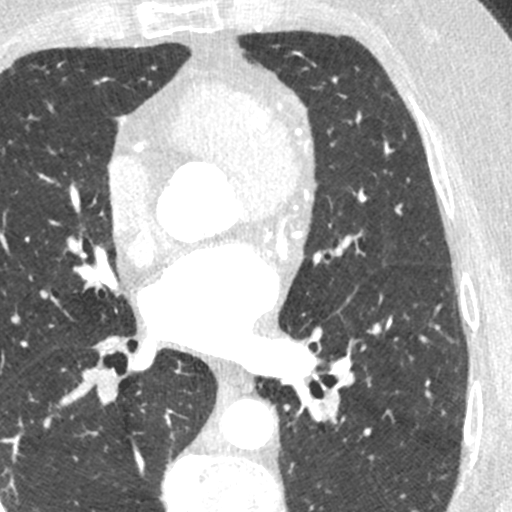

[Series 9: ts syst sharp · axial · 0.39mm/px · z∈[+1187,+1231]mm · 2 of 328 slices shown]
[im 110/328  lung]
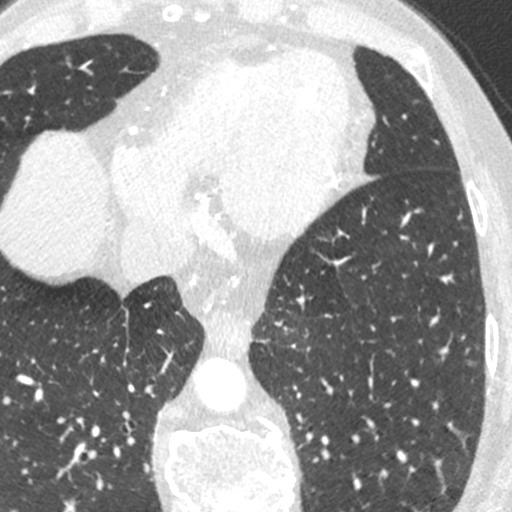
[im 219/328  lung]
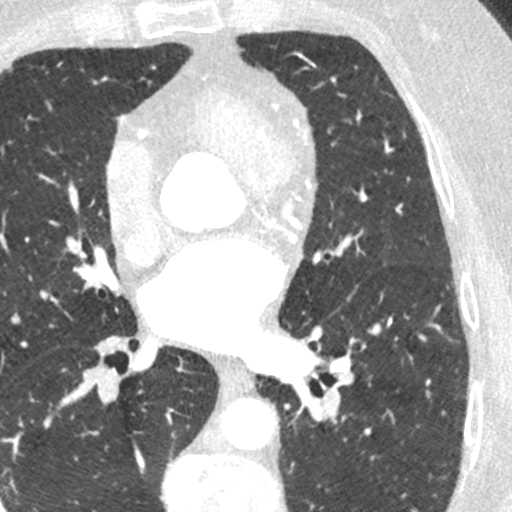

[8 of 20 positions shown; findings below may reference images not displayed]

FINDINGS: Vascular: Aortic atherosclerosis. No aortic dissection or aneurysm.
Tortuous thoracic aorta. No central pulmonary embolism, on this
non-dedicated study.

Mediastinum/Nodes: No imaged thoracic adenopathy. Small hiatal
hernia.

Lungs/Pleura: No pleural fluid.  Clear imaged lungs.

Upper Abdomen: Normal imaged portions of the liver, spleen.

Musculoskeletal: Mild loss of mid and lower thoracic vertebral body
height at multiple levels, possibly within normal variation.
IMPRESSION: 1.  No acute findings in the imaged extracardiac chest.
2. Small hiatal hernia.
3.  Aortic Atherosclerosis (FDHH3-D5D.D).
FINDINGS: Image quality: Average

Noise artifact is: Misregistration due to cardiac motion.

Coronary calcium score is 285, which places the patient in the 81st
percentile for age and sex matched control.

Coronary arteries: Normal coronary origins.  Right dominance.

Right Coronary Artery: Minimal scattered mixed atherosclerotic
plaque, <25% stenosis. Possible calcified plaque at ostial PDA,
difficult to assess degree of stenosis, though distal to this
questionable area the vessel is normal caliber and patent.

Left Main Coronary Artery: Mild mixed atherosclerotic plaque in
mid-distal left main coronary artery, 25-49% stenosis.

Left Anterior Descending Coronary Artery: Mild mixed atherosclerotic
plaque in the ostial LAD, proximal LAD, and proximal first diagonal,
25-49% stenosis. Severe stenosis in the mid LAD with mixed
atherosclerotic plaque, 70-99%.

Left Circumflex Artery: No detectable plaque or stenosis.

Aorta: Normal size, 33 mm at the sinus of Valsalva and mid ascending
aorta (level of the PA bifurcation) measured double oblique. No
calcifications. No dissection.

Aortic Valve: No calcifications.  Mildly thickened cusps.

Other findings:

Normal pulmonary vein drainage into the left atrium.

Normal left atrial appendage without a thrombus.

Normal size of the pulmonary artery.
IMPRESSION: 1. Severe CAD in mid LAD, and mild stenosis in mid and distal left
main coronary artery, CADRADS = 4. CT FFR will be performed and
reported separately.

2. Coronary calcium score is 285, which places the patient in the
81st percentile for age and sex matched control.

3. Normal coronary origin with right dominance.

*** End of Addendum ***
EXAM:
OVER-READ INTERPRETATION  CT CHEST

The following report is an over-read performed by radiologist Dr.
Guttila Parlina [REDACTED] on 01/13/2021. This over-read
does not include interpretation of cardiac or coronary anatomy or
pathology. The coronary CTA interpretation by the cardiologist is
attached.
FINDINGS: Vascular: Aortic atherosclerosis. No aortic dissection or aneurysm.
Tortuous thoracic aorta. No central pulmonary embolism, on this
non-dedicated study.

Mediastinum/Nodes: No imaged thoracic adenopathy. Small hiatal
hernia.

Lungs/Pleura: No pleural fluid.  Clear imaged lungs.

Upper Abdomen: Normal imaged portions of the liver, spleen.

Musculoskeletal: Mild loss of mid and lower thoracic vertebral body
height at multiple levels, possibly within normal variation.
IMPRESSION: 1.  No acute findings in the imaged extracardiac chest.
2. Small hiatal hernia.
3.  Aortic Atherosclerosis (FDHH3-D5D.D).

## 2023-01-04 ENCOUNTER — Encounter: Payer: Federal, State, Local not specified - PPO | Admitting: Medical

## 2023-01-04 ENCOUNTER — Ambulatory Visit (INDEPENDENT_AMBULATORY_CARE_PROVIDER_SITE_OTHER): Payer: Federal, State, Local not specified - PPO | Admitting: Medical

## 2023-01-04 VITALS — BP 112/58 | HR 72 | Temp 98.0°F | Resp 18 | Ht 62.0 in | Wt 165.0 lb

## 2023-01-04 DIAGNOSIS — I10 Essential (primary) hypertension: Secondary | ICD-10-CM

## 2023-01-04 DIAGNOSIS — F419 Anxiety disorder, unspecified: Secondary | ICD-10-CM | POA: Diagnosis not present

## 2023-01-04 DIAGNOSIS — I2510 Atherosclerotic heart disease of native coronary artery without angina pectoris: Secondary | ICD-10-CM | POA: Diagnosis not present

## 2023-01-04 DIAGNOSIS — Z1231 Encounter for screening mammogram for malignant neoplasm of breast: Secondary | ICD-10-CM

## 2023-01-04 DIAGNOSIS — Z0000 Encounter for general adult medical examination without abnormal findings: Secondary | ICD-10-CM

## 2023-01-04 MED ORDER — ISOSORBIDE MONONITRATE ER 30 MG PO TB24: 30.0000 mg | ORAL_TABLET | Freq: Every day | ORAL | 3 refills | Status: DC

## 2023-01-04 MED ORDER — SERTRALINE HCL 50 MG PO TABS: 50.0000 mg | ORAL_TABLET | Freq: Every day | ORAL | 11 refills | Status: DC
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
## 2023-01-04 NOTE — Patient Instructions (Addendum)
For you wellness exam today I have ordered cbc, cmp and lipid panel.  Declined shingrix vaccine  Recommend exercise and healthy diet.  We will let you know lab results as they come in.  Follow up date appointment will be determined after lab review.    Htn- bp controlled. Continue lisinopril 10 mg daily.  Hyperlipidemia- continue atorvastatin 40 mg daily.  Anxiety- stable. Continue sertraline 50 mg daily.  Refilling isosorsobide 30 mg daily. Will refer you back to cardiologist to follow as years since you saw last cardiologist   Preventive Care 65 Years and Older, Female Preventive care refers to lifestyle choices and visits with your health care provider that can promote health and wellness. Preventive care visits are also called wellness exams. What can I expect for my preventive care visit? Counseling Your health care provider may ask you questions about your: Medical history, including: Past medical problems. Family medical history. Pregnancy and menstrual history. History of falls. Current health, including: Memory and ability to understand (cognition). Emotional well-being. Home life and relationship well-being. Sexual activity and sexual health. Lifestyle, including: Alcohol, nicotine or tobacco, and drug use. Access to firearms. Diet, exercise, and sleep habits. Work and work Statistician. Sunscreen use. Safety issues such as seatbelt and bike helmet use. Physical exam Your health care provider will check your: Height and weight. These may be used to calculate your BMI (body mass index). BMI is a measurement that tells if you are at a healthy weight. Waist circumference. This measures the distance around your waistline. This measurement also tells if you are at a healthy weight and may help predict your risk of certain diseases, such as type 2 diabetes and high blood pressure. Heart rate and blood pressure. Body temperature. Skin for abnormal spots. What  immunizations do I need?  Vaccines are usually given at various ages, according to a schedule. Your health care provider will recommend vaccines for you based on your age, medical history, and lifestyle or other factors, such as travel or where you work. What tests do I need? Screening Your health care provider may recommend screening tests for certain conditions. This may include: Lipid and cholesterol levels. Hepatitis C test. Hepatitis B test. HIV (human immunodeficiency virus) test. STI (sexually transmitted infection) testing, if you are at risk. Lung cancer screening. Colorectal cancer screening. Diabetes screening. This is done by checking your blood sugar (glucose) after you have not eaten for a while (fasting). Mammogram. Talk with your health care provider about how often you should have regular mammograms. BRCA-related cancer screening. This may be done if you have a family history of breast, ovarian, tubal, or peritoneal cancers. Bone density scan. This is done to screen for osteoporosis. Talk with your health care provider about your test results, treatment options, and if necessary, the need for more tests. Follow these instructions at home: Eating and drinking  Eat a diet that includes fresh fruits and vegetables, whole grains, lean protein, and low-fat dairy products. Limit your intake of foods with high amounts of sugar, saturated fats, and salt. Take vitamin and mineral supplements as recommended by your health care provider. Do not drink alcohol if your health care provider tells you not to drink. If you drink alcohol: Limit how much you have to 0-1 drink a day. Know how much alcohol is in your drink. In the [place] one drink equals one 12 oz bottle of beer (355 mL), one 5 oz glass of wine (148 mL), or one 1 oz glass of  hard liquor (44 mL). Lifestyle Brush your teeth every morning and night with fluoride toothpaste. Floss one time each day. Exercise for at least 30  minutes 5 or more days each week. Do not use any products that contain nicotine or tobacco. These products include cigarettes, chewing tobacco, and vaping devices, such as e-cigarettes. If you need help quitting, ask your health care provider. Do not use drugs. If you are sexually active, practice safe sex. Use a condom or other form of protection in order to prevent STIs. Take aspirin only as told by your health care provider. Make sure that you understand how much to take and what form to take. Work with your health care provider to find out whether it is safe and beneficial for you to take aspirin daily. Ask your health care provider if you need to take a cholesterol-lowering medicine (statin). Find healthy ways to manage stress, such as: Meditation, yoga, or listening to music. Journaling. Talking to a trusted person. Spending time with friends and family. Minimize exposure to UV radiation to reduce your risk of skin cancer. Safety Always wear your seat belt while driving or riding in a vehicle. Do not drive: If you have been drinking alcohol. Do not ride with someone who has been drinking. When you are tired or distracted. While texting. If you have been using any mind-altering substances or drugs. Wear a helmet and other protective equipment during sports activities. If you have firearms in your house, make sure you follow all gun safety procedures. What's next? Visit your health care provider once a year for an annual wellness visit. Ask your health care provider how often you should have your eyes and teeth checked. Stay up to date on all vaccines. This information is not intended to replace advice given to you by your health care provider. Make sure you discuss any questions you have with your health care provider. Document Revised: 05/03/2021 Document Reviewed: 05/03/2021 Elsevier Patient Education  [place].

## 2023-01-05 LAB — CBC WITH DIFFERENTIAL/PLATELET
Absolute Monocytes: 853 cells/uL (ref 200–950)
Basophils Absolute: 41 cells/uL (ref 0–200)
Basophils Relative: 0.5 %
Eosinophils Absolute: 131 cells/uL (ref 15–500)
Eosinophils Relative: 1.6 %
HCT: 36.8 % (ref 35.0–45.0)
Hemoglobin: 12.2 g/dL (ref 11.7–15.5)
Lymphs Abs: 2419 cells/uL (ref 850–3900)
MCH: 30.4 pg (ref 27.0–33.0)
MCHC: 33.2 g/dL (ref 32.0–36.0)
MCV: 91.8 fL (ref 80.0–100.0)
MPV: 9.4 fL (ref 7.5–12.5)
Monocytes Relative: 10.4 %
Neutro Abs: 4756 cells/uL (ref 1500–7800)
Neutrophils Relative %: 58 %
Platelets: 268 10*3/uL (ref 140–400)
RBC: 4.01 10*6/uL (ref 3.80–5.10)
RDW: 13.1 % (ref 11.0–15.0)
Total Lymphocyte: 29.5 %
WBC: 8.2 10*3/uL (ref 3.8–10.8)

## 2023-01-05 LAB — COMPREHENSIVE METABOLIC PANEL
AG Ratio: 1.3 (calc) (ref 1.0–2.5)
ALT: 13 U/L (ref 6–29)
AST: 12 U/L (ref 10–35)
Albumin: 3.9 g/dL (ref 3.6–5.1)
Alkaline phosphatase (APISO): 94 U/L (ref 37–153)
BUN/Creatinine Ratio: 33 (calc) — ABNORMAL HIGH (ref 6–22)
BUN: 39 mg/dL — ABNORMAL HIGH (ref 7–25)
CO2: 24 mmol/L (ref 20–32)
Calcium: 9.3 mg/dL (ref 8.6–10.4)
Chloride: 107 mmol/L (ref 98–110)
Creat: 1.19 mg/dL — ABNORMAL HIGH (ref 0.60–1.00)
Globulin: 2.9 g/dL (calc) (ref 1.9–3.7)
Glucose, Bld: 93 mg/dL (ref 65–99)
Potassium: 4.6 mmol/L (ref 3.5–5.3)
Sodium: 141 mmol/L (ref 135–146)
Total Bilirubin: 0.4 mg/dL (ref 0.2–1.2)
Total Protein: 6.8 g/dL (ref 6.1–8.1)

## 2023-01-05 LAB — LIPID PANEL
Cholesterol: 185 mg/dL (ref <200)
HDL: 63 mg/dL (ref >=50)
LDL Cholesterol (Calc): 90 mg/dL (calc)
Non-HDL Cholesterol (Calc): 122 mg/dL (calc) (ref <130)
Total CHOL/HDL Ratio: 2.9 (calc) (ref <5.0)
Triglycerides: 229 mg/dL — ABNORMAL HIGH (ref <150)

## 2023-01-06 MED ORDER — ATORVASTATIN CALCIUM 40 MG PO TABS: 40.0000 mg | ORAL_TABLET | Freq: Every day | ORAL | 3 refills | Status: DC

## 2023-01-06 NOTE — Addendum Note (Signed)
Addended by: Anabel Halon on: 01/06/2023 07:39 PM   Modules accepted: Orders

## 2023-01-08 DIAGNOSIS — F32A Depression, unspecified: Secondary | ICD-10-CM | POA: Diagnosis not present

## 2023-01-08 DIAGNOSIS — K219 Gastro-esophageal reflux disease without esophagitis: Secondary | ICD-10-CM | POA: Diagnosis not present

## 2023-01-08 DIAGNOSIS — H25813 Combined forms of age-related cataract, bilateral: Secondary | ICD-10-CM | POA: Diagnosis not present

## 2023-01-08 DIAGNOSIS — Z79899 Other long term (current) drug therapy: Secondary | ICD-10-CM | POA: Diagnosis not present

## 2023-01-08 DIAGNOSIS — E785 Hyperlipidemia, unspecified: Secondary | ICD-10-CM | POA: Diagnosis not present

## 2023-01-08 DIAGNOSIS — I10 Essential (primary) hypertension: Secondary | ICD-10-CM | POA: Diagnosis not present

## 2023-01-08 DIAGNOSIS — F419 Anxiety disorder, unspecified: Secondary | ICD-10-CM | POA: Diagnosis not present

## 2023-01-08 DIAGNOSIS — H2513 Age-related nuclear cataract, bilateral: Secondary | ICD-10-CM | POA: Diagnosis not present

## 2023-01-08 DIAGNOSIS — H25811 Combined forms of age-related cataract, right eye: Secondary | ICD-10-CM | POA: Diagnosis not present

## 2023-01-15 DIAGNOSIS — E785 Hyperlipidemia, unspecified: Secondary | ICD-10-CM | POA: Diagnosis not present

## 2023-01-15 DIAGNOSIS — H52213 Irregular astigmatism, bilateral: Secondary | ICD-10-CM | POA: Diagnosis not present

## 2023-01-15 DIAGNOSIS — H2513 Age-related nuclear cataract, bilateral: Secondary | ICD-10-CM | POA: Diagnosis not present

## 2023-01-15 DIAGNOSIS — I2510 Atherosclerotic heart disease of native coronary artery without angina pectoris: Secondary | ICD-10-CM | POA: Diagnosis not present

## 2023-01-15 DIAGNOSIS — Z79899 Other long term (current) drug therapy: Secondary | ICD-10-CM | POA: Diagnosis not present

## 2023-01-15 DIAGNOSIS — H25813 Combined forms of age-related cataract, bilateral: Secondary | ICD-10-CM | POA: Diagnosis not present

## 2023-01-15 DIAGNOSIS — H25812 Combined forms of age-related cataract, left eye: Secondary | ICD-10-CM | POA: Diagnosis not present

## 2023-01-15 DIAGNOSIS — F419 Anxiety disorder, unspecified: Secondary | ICD-10-CM | POA: Diagnosis not present

## 2023-01-15 DIAGNOSIS — H04123 Dry eye syndrome of bilateral lacrimal glands: Secondary | ICD-10-CM | POA: Diagnosis not present

## 2023-01-15 DIAGNOSIS — I10 Essential (primary) hypertension: Secondary | ICD-10-CM | POA: Diagnosis not present

## 2023-01-25 ENCOUNTER — Encounter: Payer: Self-pay | Admitting: Cardiology

## 2023-03-08 ENCOUNTER — Encounter: Payer: Self-pay | Admitting: Cardiology

## 2023-03-08 ENCOUNTER — Ambulatory Visit: Payer: Federal, State, Local not specified - PPO | Attending: Cardiology | Admitting: Cardiology

## 2023-03-08 VITALS — BP 142/70 | HR 88 | Ht 62.0 in | Wt 172.1 lb

## 2023-03-08 DIAGNOSIS — E782 Mixed hyperlipidemia: Secondary | ICD-10-CM

## 2023-03-08 DIAGNOSIS — I10 Essential (primary) hypertension: Secondary | ICD-10-CM | POA: Diagnosis not present

## 2023-03-08 DIAGNOSIS — M7989 Other specified soft tissue disorders: Secondary | ICD-10-CM | POA: Diagnosis not present

## 2023-03-08 DIAGNOSIS — R0609 Other forms of dyspnea: Secondary | ICD-10-CM

## 2023-03-08 DIAGNOSIS — I2510 Atherosclerotic heart disease of native coronary artery without angina pectoris: Secondary | ICD-10-CM | POA: Diagnosis not present

## 2023-03-08 NOTE — Addendum Note (Signed)
Addended by: Baldo Ash D on: 03/08/2023 01:53 PM   Modules accepted: Orders

## 2023-03-08 NOTE — Patient Instructions (Addendum)
Medication Instructions:  Your physician recommends that you continue on your current medications as directed. Please refer to the Current Medication list given to you today.  *If you need a refill on your cardiac medications before your next appointment, please call your pharmacy*   Lab Work: 3rd Floor    [address]   You can come Monday through Friday 8:00 am to 11:30 am and 1:00 to 4:30. You do not need to make an appointment as the order has already been placed.    Testing/Procedures: None Ordered   Follow-Up: At [facility], you and your health needs are our priority.  As part of our continuing mission to provide you with exceptional heart care, we have created designated Provider Care Teams.  These Care Teams include your primary Cardiologist (physician) and Advanced Practice Providers (APPs -  Physician Assistants and Nurse Practitioners) who all work together to provide you with the care you need, when you need it.  We recommend signing up for the patient portal called "MyChart".  Sign up information is provided on this After Visit Summary.  MyChart is used to connect with patients for Virtual Visits (Telemedicine).  Patients are able to view lab/test results, encounter notes, upcoming appointments, etc.  Non-urgent messages can be sent to your provider as well.   To learn more about what you can do with MyChart, go to [email or web address].    Your next appointment:   1 month(s)  The format for your next appointment:   In Person  Provider:   Gypsy Balsam, MD    Other Instructions NA

## 2023-03-08 NOTE — Progress Notes (Signed)
Cardiology Consultation:    Date:  03/08/2023   ID:  Kayla Clark, DOB 04-01-1946, MRN [id number]  PCP:  Esperanza Richters, PA-C  Cardiologist:  Gypsy Balsam, MD   Referring MD: Esperanza Richters, [place]   Chief Complaint  Patient presents with   Foot Swelling   Weight Gain    17lbs in 2 months    History of Present Illness:    Kayla Clark is a 77 y.o. female who is being seen today for the evaluation of weight gain and swelling of lower extremities at the request of Esperanza Richters, [place].  Past medical history significant for coronary artery disease, in 2022 she had coronary CT angio done which showed hemodynamically insignificant stenosis in the LAD and some borderline stenosis in different vessels.  Additional problem include essential hypertension, gastroesophageal reflux disease, dyslipidemia.  She requested to be see me again because she noted swelling of lower extremities as well as weight gain within the last few months.  She described to have some fatigue tiredness shortness of breath, she have to wake up many times during the night to go to the restroom, she denies have any paroxysmal nocturnal dyspnea though.  No palpitations no dizziness.  Past Medical History:  Diagnosis Date   Esophageal reflux    Gastro-esophageal reflux disease with esophagitis 06/23/2009   Formatting of this note might be different from the original. Chronic Reflux Esophagitis   Hypertension    hx of borderline bp in past. Never was on meds.   Mixed hyperlipidemia 05/17/2021   Osteoarthritis     Past Surgical History:  Procedure Laterality Date   KNEE SURGERY     2 knee transplants    Current Medications: Current Meds  Medication Sig   alendronate (FOSAMAX) 10 MG tablet TAKE 1 TABLET BY MOUTH DAILY BEFORE BREAKFAST. TAKE WITH A FULL GLASS OF WATER ON AN EMPTY STOMACH. (Patient taking differently: Take 10 mg by mouth daily before breakfast.)   aspirin EC 81 MG tablet Take 1 tablet  (81 mg total) by mouth daily. Swallow whole.   atorvastatin (LIPITOR) 40 MG tablet TAKE 1 TABLET BY MOUTH EVERY DAY (Patient taking differently: Take 40 mg by mouth daily.)   atorvastatin (LIPITOR) 40 MG tablet Take 1 tablet (40 mg total) by mouth daily.   fluticasone (FLONASE) 50 MCG/ACT nasal spray Place 2 sprays into both nostrils daily.   isosorbide mononitrate (IMDUR) 30 MG 24 hr tablet Take 1 tablet (30 mg total) by mouth daily. Please contact the office to schedule appointment for additional refills. 2ed Attempt.   lisinopril (ZESTRIL) 10 MG tablet TAKE 2 TABLETS BY MOUTH EVERY DAY (Patient taking differently: Take 10 mg by mouth daily.)   nitroGLYCERIN (NITROSTAT) 0.4 MG SL tablet PLACE 1 TABLET UNDER THE TONGUE EVERY 5 (FIVE) MINUTES AS NEEDED FOR CHEST PAIN. UP TO 3 TIMES (Patient taking differently: Place 0.4 mg under the tongue every 5 (five) minutes as needed for chest pain.)   sertraline (ZOLOFT) 50 MG tablet Take 1 tablet (50 mg total) by mouth daily.   Vitamin D, Ergocalciferol, (DRISDOL) 1.25 MG (50000 UNIT) CAPS capsule Take 1 capsule (50,000 Units total) by mouth every 7 (seven) days.     Allergies:   Patient has no known allergies.   Social History   Socioeconomic History   Marital status: Widowed    Spouse name: Not on file   Number of children: Not on file   Years of education: Not on file  Highest education level: Not on file  Occupational History   Not on file  Tobacco Use   Smoking status: Never   Smokeless tobacco: Never  Substance and Sexual Activity   Alcohol use: No    Alcohol/week: 0.0 standard drinks of alcohol   Drug use: No   Sexual activity: Not on file  Other Topics Concern   Not on file  Social History Narrative   ** Merged History Encounter **       Social Determinants of Health   Financial Resource Strain: Not on file  Food Insecurity: Not on file  Transportation Needs: Not on file  Physical Activity: Not on file  Stress: Not on file   Social Connections: Not on file     Family History: The patient'sfamily history includes Heart disease in her father and mother. ROS:   Please see the history of present illness.    All 14 point review of systems negative except as described per history of present illness.  EKGs/Labs/Other Studies Reviewed:    The following studies were reviewed today:   EKG:  EKG is  ordered today.  The ekg ordered today demonstrates normal sinus rhythm, cannot rule out septal infarct, no ST segment changes  Recent Labs: 01/04/2023: ALT 13; BUN 39; Creat 1.19; Hemoglobin 12.2; Platelets 268; Potassium 4.6; Sodium 141  Recent Lipid Panel    Component Value Date/Time   CHOL 185 01/04/2023 1549   TRIG 229 (H) 01/04/2023 1549   HDL 63 01/04/2023 1549   CHOLHDL 2.9 01/04/2023 1549   VLDL 16.0 09/29/2020 0750   LDLCALC 90 01/04/2023 1549    Physical Exam:    VS:  BP (!) 142/70 (BP Location: Left Arm, Patient Position: Sitting)   Pulse 88   Ht  (1.575 m)   Wt 172 lb 1.9 oz (78.1 kg)   SpO2 97%   BMI 31.48 kg/m     Wt Readings from Last 3 Encounters:  03/08/23 172 lb 1.9 oz (78.1 kg)  01/04/23 165 lb (74.8 kg)  10/23/21 150 lb 9.6 oz (68.3 kg)     GEN:  Well nourished, well developed in no acute distress HEENT: Normal NECK: No JVD; No carotid bruits LYMPHATICS: No lymphadenopathy CARDIAC: RRR, no murmurs, no rubs, no gallops RESPIRATORY:  Clear to auscultation without rales, wheezing or rhonchi  ABDOMEN: Soft, non-tender, non-distended MUSCULOSKELETAL:  No edema; No deformity  SKIN: Warm and dry NEUROLOGIC:  Alert and oriented x 3 PSYCHIATRIC:  Normal affect   ASSESSMENT:    1. Coronary artery disease involving native coronary artery of native heart, unspecified whether angina present   2. Primary hypertension   3. Mixed hyperlipidemia   4. Swelling of both lower extremities    PLAN:    In order of problems listed above:  Weight gain with swelling of lower  extremities, obviously concerned about potentially having congestive heart failure.  I will do proBNP I will do Chem-12 as well as TSH.  I will not initiate any therapy until have better understanding of the condition. Coronary artery disease does not have any symptoms suggesting reactivation of the problem, denies have any chest pain tightness squeezing pressure burning chest. Essential hypertension blood pressure fairly controlled we will continue present management.  Anticipate need to add diuretics which should help with the blood pressure as well. Dyslipidemia I did review K PN which show me her LDL of 90 HDL 63.  She takes Lipitor 40 will increase to Lipitor 80. Risk modifications she  is not taking any antiplatelet therapy.  I will start aspirin 81 mg daily   Medication Adjustments/Labs and Tests Ordered: Current medicines are reviewed at length with the patient today.  Concerns regarding medicines are outlined above.  No orders of the defined types were placed in this encounter.  No orders of the defined types were placed in this encounter.   Signed, Georgeanna Lea, MD, [facility]. 03/08/2023 1:45 PM    [facility]

## 2023-03-27 ENCOUNTER — Ambulatory Visit (HOSPITAL_BASED_OUTPATIENT_CLINIC_OR_DEPARTMENT_OTHER)
Admission: RE | Admit: 2023-03-27 | Discharge: 2023-03-27 | Disposition: A | Discharge status: Home / Self Care | Payer: Federal, State, Local not specified - PPO | Source: Ambulatory Visit | Attending: Cardiology | Admitting: Cardiology

## 2023-03-27 DIAGNOSIS — R0609 Other forms of dyspnea: Secondary | ICD-10-CM | POA: Diagnosis not present

## 2023-03-27 DIAGNOSIS — M7989 Other specified soft tissue disorders: Secondary | ICD-10-CM | POA: Insufficient documentation

## 2023-03-27 LAB — ECHOCARDIOGRAM COMPLETE
Area-P 1/2: 3.3 cm2
S' Lateral: 1.6 cm

## 2023-04-05 ENCOUNTER — Telehealth: Payer: Self-pay | Admitting: Cardiology

## 2023-04-05 NOTE — Telephone Encounter (Signed)
Patient wants to know if she will need to do lab work prior to her visit on 5/22.

## 2023-04-10 ENCOUNTER — Ambulatory Visit: Payer: Federal, State, Local not specified - PPO | Admitting: Cardiology

## 2023-04-10 NOTE — Telephone Encounter (Signed)
Returned pt call VM full 

## 2023-04-10 NOTE — Telephone Encounter (Signed)
Patient stated she had lab work done on 2/16 and wants to know if she will still need to do lab tests ordered on 4/19. Patient also wanted to apologize to Dr. Vanetta Shawl for missing today's (5/22) appointment.

## 2023-04-11 ENCOUNTER — Other Ambulatory Visit: Payer: Self-pay

## 2023-04-12 DIAGNOSIS — R0609 Other forms of dyspnea: Secondary | ICD-10-CM | POA: Diagnosis not present

## 2023-04-12 DIAGNOSIS — M7989 Other specified soft tissue disorders: Secondary | ICD-10-CM | POA: Diagnosis not present

## 2023-04-12 NOTE — Telephone Encounter (Signed)
Returned pt call VM full 

## 2023-04-13 LAB — COMPREHENSIVE METABOLIC PANEL
ALT: 12 IU/L (ref 0–32)
AST: 15 IU/L (ref 0–40)
Albumin/Globulin Ratio: 1.7 (ref 1.2–2.2)
Albumin: 4.1 g/dL (ref 3.8–4.8)
Alkaline Phosphatase: 111 IU/L (ref 44–121)
BUN/Creatinine Ratio: 28 (ref 12–28)
BUN: 27 mg/dL (ref 8–27)
Bilirubin Total: 0.5 mg/dL (ref 0.0–1.2)
CO2: 21 mmol/L (ref 20–29)
Calcium: 9.2 mg/dL (ref 8.7–10.3)
Chloride: 107 mmol/L — ABNORMAL HIGH (ref 96–106)
Creatinine, Ser: 0.97 mg/dL (ref 0.57–1.00)
Globulin, Total: 2.4 g/dL (ref 1.5–4.5)
Glucose: 91 mg/dL (ref 70–99)
Potassium: 4.3 mmol/L (ref 3.5–5.2)
Sodium: 140 mmol/L (ref 134–144)
Total Protein: 6.5 g/dL (ref 6.0–8.5)
eGFR: 61 mL/min/{1.73_m2} (ref >59)

## 2023-04-13 LAB — PRO B NATRIURETIC PEPTIDE: NT-Pro BNP: 320 pg/mL (ref 0–738)

## 2023-04-13 LAB — TSH: TSH: 2.28 u[IU]/mL (ref 0.450–4.500)

## 2023-04-16 ENCOUNTER — Ambulatory Visit: Payer: Federal, State, Local not specified - PPO | Attending: Cardiology | Admitting: Cardiology

## 2023-04-16 ENCOUNTER — Encounter: Payer: Self-pay | Admitting: Cardiology

## 2023-04-16 VITALS — BP 132/84 | HR 64 | Ht 62.0 in | Wt 168.0 lb

## 2023-04-16 DIAGNOSIS — R0683 Snoring: Secondary | ICD-10-CM

## 2023-04-16 DIAGNOSIS — E782 Mixed hyperlipidemia: Secondary | ICD-10-CM | POA: Diagnosis not present

## 2023-04-16 DIAGNOSIS — I10 Essential (primary) hypertension: Secondary | ICD-10-CM

## 2023-04-16 DIAGNOSIS — M7989 Other specified soft tissue disorders: Secondary | ICD-10-CM | POA: Diagnosis not present

## 2023-04-16 DIAGNOSIS — I2510 Atherosclerotic heart disease of native coronary artery without angina pectoris: Secondary | ICD-10-CM | POA: Diagnosis not present

## 2023-04-16 MED ORDER — ATORVASTATIN CALCIUM 80 MG PO TABS: 80.0000 mg | ORAL_TABLET | Freq: Every day | ORAL | 3 refills | Status: DC

## 2023-04-16 MED ORDER — FUROSEMIDE 20 MG PO TABS: 20.0000 mg | ORAL_TABLET | Freq: Every day | ORAL | 3 refills | Status: DC

## 2023-04-16 NOTE — Progress Notes (Unsigned)
Cardiology Office Note:    Date:  04/16/2023   ID:  Kayla Clark, Kayla Clark 04/16/1946, MRN [id number]  PCP:  Esperanza Richters, PA-C  Cardiologist:  Gypsy Balsam, MD    Referring MD: Esperanza Richters, [place]   Chief Complaint  Patient presents with   Results    History of Present Illness:    Kayla Clark is a 77 y.o. female with past medical history significant for coronary artery disease, she did have coronary CT angiogram in 2022 which showed hemodynamically insignificant stenosis in the LAD as well as borderline stenosis in different vessels, FFR was negative, also essential hypertension, dyslipidemia gastroesophageal reflux disease.  Last time she presented to me with swelling of lower extremities.  Evaluation is involved echocardiogram which showed preserved ejection fraction, stage 1 diastolic dysfunctions, she did have prior BNP which was negative.  Comes today to months for follow-up  Past Medical History:  Diagnosis Date   Esophageal reflux    Gastro-esophageal reflux disease with esophagitis 06/23/2009   Formatting of this note might be different from the original. Chronic Reflux Esophagitis   Hypertension    hx of borderline bp in past. Never was on meds.   Mixed hyperlipidemia 05/17/2021   Osteoarthritis     Past Surgical History:  Procedure Laterality Date   KNEE SURGERY     2 knee transplants    Current Medications: Current Meds  Medication Sig   alendronate (FOSAMAX) 10 MG tablet TAKE 1 TABLET BY MOUTH DAILY BEFORE BREAKFAST. TAKE WITH A FULL GLASS OF WATER ON AN EMPTY STOMACH. (Patient taking differently: Take 10 mg by mouth daily before breakfast.)   aspirin EC 81 MG tablet Take 1 tablet (81 mg total) by mouth daily. Swallow whole.   atorvastatin (LIPITOR) 40 MG tablet TAKE 1 TABLET BY MOUTH EVERY DAY (Patient taking differently: Take 40 mg by mouth daily.)   atorvastatin (LIPITOR) 40 MG tablet Take 1 tablet (40 mg total) by mouth daily.   fluticasone  (FLONASE) 50 MCG/ACT nasal spray Place 2 sprays into both nostrils daily.   isosorbide mononitrate (IMDUR) 30 MG 24 hr tablet Take 1 tablet (30 mg total) by mouth daily. Please contact the office to schedule appointment for additional refills. 2ed Attempt.   lisinopril (ZESTRIL) 10 MG tablet TAKE 2 TABLETS BY MOUTH EVERY DAY (Patient taking differently: Take 10 mg by mouth daily.)   nitroGLYCERIN (NITROSTAT) 0.4 MG SL tablet PLACE 1 TABLET UNDER THE TONGUE EVERY 5 (FIVE) MINUTES AS NEEDED FOR CHEST PAIN. UP TO 3 TIMES (Patient taking differently: Place 0.4 mg under the tongue every 5 (five) minutes as needed for chest pain.)   sertraline (ZOLOFT) 50 MG tablet Take 1 tablet (50 mg total) by mouth daily.   Vitamin D, Ergocalciferol, (DRISDOL) 1.25 MG (50000 UNIT) CAPS capsule Take 1 capsule (50,000 Units total) by mouth every 7 (seven) days.     Allergies:   Patient has no known allergies.   Social History   Socioeconomic History   Marital status: Widowed    Spouse name: Not on file   Number of children: Not on file   Years of education: Not on file   Highest education level: Not on file  Occupational History   Not on file  Tobacco Use   Smoking status: Never   Smokeless tobacco: Never  Substance and Sexual Activity   Alcohol use: No    Alcohol/week: 0.0 standard drinks of alcohol   Drug use: No   Sexual activity: Not  on file  Other Topics Concern   Not on file  Social History Narrative   ** Merged History Encounter **       Social Determinants of Health   Financial Resource Strain: Not on file  Food Insecurity: Not on file  Transportation Needs: Not on file  Physical Activity: Not on file  Stress: Not on file  Social Connections: Not on file     Family History: The patient's family history includes Heart disease in her father and mother. ROS:   Please see the history of present illness.    All 14 point review of systems negative except as described per history of  present illness  EKGs/Labs/Other Studies Reviewed:      Recent Labs: 01/04/2023: Hemoglobin 12.2; Platelets 268 04/12/2023: ALT 12; BUN 27; Creatinine, Ser 0.97; NT-Pro BNP 320; Potassium 4.3; Sodium 140; TSH 2.280  Recent Lipid Panel    Component Value Date/Time   CHOL 185 01/04/2023 1549   TRIG 229 (H) 01/04/2023 1549   HDL 63 01/04/2023 1549   CHOLHDL 2.9 01/04/2023 1549   VLDL 16.0 09/29/2020 0750   LDLCALC 90 01/04/2023 1549    Physical Exam:    VS:  BP 132/84 (BP Location: Left Arm, Patient Position: Sitting)   Pulse 64   Ht 5\' 2"  (1.575 m)   Wt 168 lb (76.2 kg)   SpO2 95%   BMI 30.73 kg/m     Wt Readings from Last 3 Encounters:  04/16/23 168 lb (76.2 kg)  03/08/23 172 lb 1.9 oz (78.1 kg)  01/04/23 165 lb (74.8 kg)     GEN:  Well nourished, well developed in no acute distress HEENT: Normal NECK: No JVD; No carotid bruits LYMPHATICS: No lymphadenopathy CARDIAC: RRR, no murmurs, no rubs, no gallops RESPIRATORY:  Clear to auscultation without rales, wheezing or rhonchi  ABDOMEN: Soft, non-tender, non-distended MUSCULOSKELETAL:  No edema; No deformity  SKIN: Warm and dry LOWER EXTREMITIES: no swelling NEUROLOGIC:  Alert and oriented x 3 PSYCHIATRIC:  Normal affect   ASSESSMENT:    1. Swelling of both lower extremities   2. Mixed hyperlipidemia   3. Primary hypertension   4. Coronary artery disease involving native coronary artery of native heart, unspecified whether angina present    PLAN:    In order of problems listed above:  Swelling of lower extremities so far no cardiac explanation for this phenomenon.  I will initiate Lasix 20 mg daily for her comfort.  I will schedule her to have sleep study make sure she does have an obstructive sleep apnea.  She said that she sleeps alone so she is not sure if she is snoring however before when her husband was still alive he was complaining that she was snoring. Mixed dyslipidemia I did review K PN which show me  her LDL of 90 HDL 63, she is taking Lipitor 40 will go up to Lipitor 80. Coronary disease stable denies have any symptoms that would indicate reactivation of the problem.  She is on antiplatelet therapy as well as statin which I will continue.   Medication Adjustments/Labs and Tests Ordered: Current medicines are reviewed at length with the patient today.  Concerns regarding medicines are outlined above.  No orders of the defined types were placed in this encounter.  Medication changes: No orders of the defined types were placed in this encounter.   Signed, Georgeanna Lea, MD, [facility] 04/16/2023 3:54 PM    [facility]

## 2023-04-16 NOTE — Patient Instructions (Addendum)
Medication Instructions:   INCREASE: Lipitor to 80mg  daily- You may double your current dose and your next refill will reflect your new dose.   START: Lasix 20mg  1 tablet daily   Lab Work: None Ordered If you have labs (blood work) drawn today and your tests are completely normal, you will receive your results only by: MyChart Message (if you have MyChart) OR A paper copy in the mail If you have any lab test that is abnormal or we need to change your treatment, we will call you to review the results.   Testing/Procedures: None Ordered   Follow-Up: At [facility], you and your health needs are our priority.  As part of our continuing mission to provide you with exceptional heart care, we have created designated Provider Care Teams.  These Care Teams include your primary Cardiologist (physician) and Advanced Practice Providers (APPs -  Physician Assistants and Nurse Practitioners) who all work together to provide you with the care you need, when you need it.  We recommend signing up for the patient portal called "MyChart".  Sign up information is provided on this After Visit Summary.  MyChart is used to connect with patients for Virtual Visits (Telemedicine).  Patients are able to view lab/test results, encounter notes, upcoming appointments, etc.  Non-urgent messages can be sent to your provider as well.   To learn more about what you can do with MyChart, go to [email or web address].    Your next appointment:   3 month(s)  The format for your next appointment:   In Person  Provider:   Gypsy Balsam, MD    Other Instructions [facility] Sleep Study- Will call when Insurance Authorizes

## 2023-04-17 ENCOUNTER — Telehealth: Payer: Self-pay

## 2023-04-17 NOTE — Telephone Encounter (Signed)
**Note De-identified Geriann Lafont Obfuscation** -----  **Note De-Identified Nalina Yeatman Obfuscation** Message from Neena Rhymes, RN sent at 04/16/2023  4:00 PM EDT ----- Pt needs Auth for [facility] sleep study- TY  Dede

## 2023-04-17 NOTE — Telephone Encounter (Signed)
**Note De-Identified [name] Obfuscation** 5/29-READY- NO PA REQ for Kayla Clark.

## 2023-04-18 ENCOUNTER — Telehealth: Payer: Self-pay

## 2023-04-18 NOTE — Telephone Encounter (Signed)
Spoke to the patient notified of results, she said diuretic are helping with swelling is very pleased.

## 2023-04-18 NOTE — Telephone Encounter (Signed)
-----   Message from Georgeanna Lea, MD sent at 04/18/2023  9:56 AM EDT ----- All blood tests are looking good, no explanation for swelling, hopefully feels better with diabetic I gave her

## 2023-05-01 ENCOUNTER — Other Ambulatory Visit: Payer: Self-pay | Admitting: Medical

## 2023-07-23 ENCOUNTER — Encounter: Payer: Self-pay | Admitting: Cardiology

## 2023-07-23 ENCOUNTER — Ambulatory Visit: Payer: Federal, State, Local not specified - PPO | Attending: Cardiology | Admitting: Cardiology

## 2023-07-23 VITALS — BP 124/64 | HR 69 | Ht 62.5 in | Wt 164.0 lb

## 2023-07-23 DIAGNOSIS — I10 Essential (primary) hypertension: Secondary | ICD-10-CM

## 2023-07-23 DIAGNOSIS — M7989 Other specified soft tissue disorders: Secondary | ICD-10-CM | POA: Diagnosis not present

## 2023-07-23 DIAGNOSIS — I2510 Atherosclerotic heart disease of native coronary artery without angina pectoris: Secondary | ICD-10-CM

## 2023-07-23 NOTE — Progress Notes (Signed)
Cardiology Office Note:    Date:  07/23/2023   ID:  Kayla Clark, DOB 10-03-1946, MRN [id number]  PCP:  Kayla Richters, PA-C  Cardiologist:  Kayla Balsam, MD    Referring MD: Kayla Richters, PA-C     History of Present Illness:    Kayla Clark is a 77 y.o. female past medical history significant for coronary artery disease she did have coronary CT angio done in 2022 showed hemodynamically insignificant stenosis of LAD as well as borderline stenosis in different vessels FFR was negative.  Additional problem include essential hypertension, dyslipidemia, gastroesophageal reflux disease.  She was referred to [place] because of swelling of lower extremities, workup so far has been negative.  Echocardiogram showed preserved ejection fraction stage Y was diastolic dysfunction, proBNP was normal.  I gave him a very small dose of diuretic to help with the symptoms she is coming today to my office for follow-up.  Overall doing very well.  Denies have any chest pain tightness squeezing pressure burning chest no palpitation dizziness swelling of lower extremities.  She is very happy with her retirement  Past Medical History:  Diagnosis Date   Esophageal reflux    Gastro-esophageal reflux disease with esophagitis 06/23/2009   Formatting of this note might be different from the original. Chronic Reflux Esophagitis   Hypertension    hx of borderline bp in past. Never was on meds.   Mixed hyperlipidemia 05/17/2021   Osteoarthritis     Past Surgical History:  Procedure Laterality Date   KNEE SURGERY     2 knee transplants    Current Medications: Current Meds  Medication Sig   alendronate (FOSAMAX) 10 MG tablet TAKE 1 TABLET BY MOUTH DAILY BEFORE BREAKFAST. TAKE WITH A FULL GLASS OF WATER ON AN EMPTY STOMACH. (Patient taking differently: Take 10 mg by mouth daily before breakfast.)   aspirin EC 81 MG tablet Take 1 tablet (81 mg total) by mouth daily. Swallow whole.   atorvastatin  (LIPITOR) 80 MG tablet Take 1 tablet (80 mg total) by mouth daily.   fluticasone (FLONASE) 50 MCG/ACT nasal spray Place 2 sprays into both nostrils daily.   furosemide (LASIX) 20 MG tablet Take 1 tablet (20 mg total) by mouth daily.   isosorbide mononitrate (IMDUR) 30 MG 24 hr tablet Take 1 tablet (30 mg total) by mouth daily. Please contact the office to schedule appointment for additional refills. 2ed Attempt.   lisinopril (ZESTRIL) 10 MG tablet TAKE 2 TABLETS BY MOUTH EVERY DAY (Patient taking differently: Take 10 mg by mouth daily.)   nitroGLYCERIN (NITROSTAT) 0.4 MG SL tablet PLACE 1 TABLET UNDER THE TONGUE EVERY 5 (FIVE) MINUTES AS NEEDED FOR CHEST PAIN. UP TO 3 TIMES (Patient taking differently: Place 0.4 mg under the tongue every 5 (five) minutes as needed for chest pain.)   sertraline (ZOLOFT) 50 MG tablet Take 1 tablet (50 mg total) by mouth daily.   Vitamin D, Ergocalciferol, (DRISDOL) 1.25 MG (50000 UNIT) CAPS capsule Take 1 capsule (50,000 Units total) by mouth every 7 (seven) days.     Allergies:   Patient has no known allergies.   Social History   Socioeconomic History   Marital status: Widowed    Spouse name: Not on file   Number of children: Not on file   Years of education: Not on file   Highest education level: Not on file  Occupational History   Not on file  Tobacco Use   Smoking status: Never   Smokeless tobacco:  Never  Substance and Sexual Activity   Alcohol use: No    Alcohol/week: 0.0 standard drinks of alcohol   Drug use: No   Sexual activity: Not on file  Other Topics Concern   Not on file  Social History Narrative   ** Merged History Encounter **       Social Determinants of Health   Financial Resource Strain: Not on file  Food Insecurity: Not on file  Transportation Needs: Not on file  Physical Activity: Not on file  Stress: Not on file  Social Connections: Unknown (09/26/2022)   Received from [facility]   Social Network     Social Network: Not on file     Family History: The patient's family history includes Heart disease in her father and mother. ROS:   Please see the history of present illness.    All 14 point review of systems negative except as described per history of present illness  EKGs/Labs/Other Studies Reviewed:         Recent Labs: 01/04/2023: Hemoglobin 12.2; Platelets 268 04/12/2023: ALT 12; BUN 27; Creatinine, Ser 0.97; NT-Pro BNP 320; Potassium 4.3; Sodium 140; TSH 2.280  Recent Lipid Panel    Component Value Date/Time   CHOL 185 01/04/2023 1549   TRIG 229 (H) 01/04/2023 1549   HDL 63 01/04/2023 1549   CHOLHDL 2.9 01/04/2023 1549   VLDL 16.0 09/29/2020 0750   LDLCALC 90 01/04/2023 1549    Physical Exam:    VS:  BP 124/64 (BP Location: Left Arm, Patient Position: Sitting)   Pulse 69   Ht 5' 2.5" (1.588 m)   Wt 164 lb (74.4 kg)   SpO2 95%   BMI 29.52 kg/m     Wt Readings from Last 3 Encounters:  07/23/23 164 lb (74.4 kg)  04/16/23 168 lb (76.2 kg)  03/08/23 172 lb 1.9 oz (78.1 kg)     GEN:  Well nourished, well developed in no acute distress HEENT: Normal NECK: No JVD; No carotid bruits LYMPHATICS: No lymphadenopathy CARDIAC: RRR, no murmurs, no rubs, no gallops RESPIRATORY:  Clear to auscultation without rales, wheezing or rhonchi  ABDOMEN: Soft, non-tender, non-distended MUSCULOSKELETAL:  No edema; No deformity  SKIN: Warm and dry LOWER EXTREMITIES: no swelling NEUROLOGIC:  Alert and oriented x 3 PSYCHIATRIC:  Normal affect   ASSESSMENT:    1. Coronary artery disease involving native coronary artery of native heart without angina pectoris   2. Primary hypertension   3. Swelling of both lower extremities    PLAN:    In order of problems listed above:  Swelling of lower extremities no good cardiac explanation for this small dose of diuretic ask her to take it on a as needed basis. Coronary disease stable from that point review on appropriate medication  which I will continue. Dyslipidemia I did review her K PN which show me her HDL of 63 LDL 90.  Will repeat fasting lipid profile. Essential hypertension blood pressure well-controlled   Medication Adjustments/Labs and Tests Ordered: Current medicines are reviewed at length with the patient today.  Concerns regarding medicines are outlined above.  No orders of the defined types were placed in this encounter.  Medication changes: No orders of the defined types were placed in this encounter.   Signed, Georgeanna Lea, MD, [facility] 07/23/2023 1:14 PM    [facility]

## 2023-07-23 NOTE — Patient Instructions (Signed)

## 2023-10-27 ENCOUNTER — Other Ambulatory Visit: Payer: Self-pay | Admitting: Medical

## 2023-12-22 ENCOUNTER — Other Ambulatory Visit: Payer: Self-pay | Admitting: Medical

## 2024-01-22 ENCOUNTER — Other Ambulatory Visit: Payer: Self-pay | Admitting: Cardiology

## 2024-01-22 NOTE — Telephone Encounter (Signed)
 Prescription sent to pharmacy.

## 2024-04-18 ENCOUNTER — Other Ambulatory Visit: Payer: Self-pay | Admitting: Medical

## 2024-07-13 ENCOUNTER — Ambulatory Visit: Attending: Cardiology | Admitting: Cardiology

## 2024-07-13 ENCOUNTER — Encounter: Payer: Self-pay | Admitting: Cardiology

## 2024-07-13 VITALS — BP 130/82 | HR 83 | Resp 18 | Ht 62.5 in | Wt 176.1 lb

## 2024-07-13 DIAGNOSIS — E782 Mixed hyperlipidemia: Secondary | ICD-10-CM | POA: Insufficient documentation

## 2024-07-13 DIAGNOSIS — I2510 Atherosclerotic heart disease of native coronary artery without angina pectoris: Secondary | ICD-10-CM | POA: Insufficient documentation

## 2024-07-13 DIAGNOSIS — I10 Essential (primary) hypertension: Secondary | ICD-10-CM | POA: Insufficient documentation

## 2024-07-13 NOTE — Progress Notes (Unsigned)
 Cardiology Office Note:    Date:  07/13/2024   ID:  Kayla, Clark 22-Nov-1945, MRN [id number]  PCP:  Dorina Loving, PA-C  Cardiologist:  Lamar Fitch, MD    Referring MD: Saguier, Edward, PA-C   No chief complaint on file.   History of Present Illness:    Kayla Clark is a 78 y.o. female past medical history significant for coronary artery disease in 2022 she did have coronary CT angio showing hemodynamically insignificant stenosis of LAD with borderline stenosis in different vessels FFR was negative.  Additional problem include essential hypertension, dyslipidemia, gastroesophageal reflux disease.  She was referred to us  because of swollen legs workup was negative.  Comes today doing well denies have any chest pain tightness squeezing pressure burning chest no swelling of lower extremities overall doing well  Past Medical History:  Diagnosis Date   Esophageal reflux    Gastro-esophageal reflux disease with esophagitis 06/23/2009   Formatting of this note might be different from the original. Chronic Reflux Esophagitis   Hypertension    hx of borderline bp in past. Never was on meds.   Mixed hyperlipidemia 05/17/2021   Osteoarthritis     Past Surgical History:  Procedure Laterality Date   KNEE SURGERY     2 knee transplants    Current Medications: Current Meds  Medication Sig   alendronate  (FOSAMAX ) 10 MG tablet TAKE 1 TABLET BY MOUTH DAILY BEFORE BREAKFAST. TAKE WITH A FULL GLASS OF WATER ON AN EMPTY STOMACH.   aspirin  EC 81 MG tablet Take 1 tablet (81 mg total) by mouth daily. Swallow whole.   atorvastatin  (LIPITOR) 80 MG tablet TAKE 1 TABLET BY MOUTH EVERY DAY   furosemide  (LASIX ) 20 MG tablet TAKE 1 TABLET BY MOUTH EVERY DAY   isosorbide  mononitrate (IMDUR ) 30 MG 24 hr tablet TAKE 1 TABLET BY MOUTH DAILY. PLEASE CONTACT THE OFFICE TO SCHEDULE APPOINTMENT FOR ADDITIONAL REFILLS. 2ED ATTEMPT.   lisinopril  (ZESTRIL ) 10 MG tablet TAKE 2 TABLETS BY MOUTH  EVERY DAY   nitroGLYCERIN  (NITROSTAT ) 0.4 MG SL tablet PLACE 1 TABLET UNDER THE TONGUE EVERY 5 (FIVE) MINUTES AS NEEDED FOR CHEST PAIN. UP TO 3 TIMES   sertraline  (ZOLOFT ) 50 MG tablet TAKE 1 TABLET BY MOUTH EVERY DAY   Vitamin D , Ergocalciferol , (DRISDOL ) 1.25 MG (50000 UNIT) CAPS capsule Take 1 capsule (50,000 Units total) by mouth every 7 (seven) days.     Allergies:   Patient has no known allergies.   Social History   Socioeconomic History   Marital status: Widowed    Spouse name: Not on file   Number of children: Not on file   Years of education: Not on file   Highest education level: Not on file  Occupational History   Not on file  Tobacco Use   Smoking status: Never   Smokeless tobacco: Never  Substance and Sexual Activity   Alcohol use: No    Alcohol/week: 0.0 standard drinks of alcohol   Drug use: No   Sexual activity: Not on file  Other Topics Concern   Not on file  Social History Narrative   ** Merged History Encounter **       Social Drivers of Health   Financial Resource Strain: Not on file  Food Insecurity: Not on file  Transportation Needs: Not on file  Physical Activity: Not on file  Stress: Not on file  Social Connections: Unknown (09/26/2022)   Received from [facility]   Social Network  Social Network: Not on file     Family History: The patient's family history includes Heart disease in her father and mother. ROS:   Please see the history of present illness.    All 14 point review of systems negative except as described per history of present illness  EKGs/Labs/Other Studies Reviewed:         Recent Labs: No results found for requested labs within last 365 days.  Recent Lipid Panel    Component Value Date/Time   CHOL 185 01/04/2023 1549   TRIG 229 (H) 01/04/2023 1549   HDL 63 01/04/2023 1549   CHOLHDL 2.9 01/04/2023 1549   VLDL 16.0 09/29/2020 0750   LDLCALC 90 01/04/2023 1549    Physical Exam:    VS:  BP 130/82 (BP  Location: Left Arm, Patient Position: Sitting, Cuff Size: Normal)   Pulse 83   Resp 18   Ht 5' 2.5 (1.588 m)   Wt 176 lb 0.8 oz (79.9 kg)   SpO2 97%   BMI 31.69 kg/m     Wt Readings from Last 3 Encounters:  07/13/24 176 lb 0.8 oz (79.9 kg)  07/23/23 164 lb (74.4 kg)  04/16/23 168 lb (76.2 kg)     GEN:  Well nourished, well developed in no acute distress HEENT: Normal NECK: No JVD; No carotid bruits LYMPHATICS: No lymphadenopathy CARDIAC: RRR, no murmurs, no rubs, no gallops RESPIRATORY:  Clear to auscultation without rales, wheezing or rhonchi  ABDOMEN: Soft, non-tender, non-distended MUSCULOSKELETAL:  No edema; No deformity  SKIN: Warm and dry LOWER EXTREMITIES: no swelling NEUROLOGIC:  Alert and oriented x 3 PSYCHIATRIC:  Normal affect   ASSESSMENT:    1. Coronary artery disease involving native coronary artery of native heart without angina pectoris   2. Primary hypertension   3. Mixed hyperlipidemia    PLAN:    In order of problems listed above:  Coronary disease stable denies have any signs and symptoms of reactivation of the problem, sadly she stopped taking aspirin .  I spent great of time trying to explain to her why aspirin  is important encourage her to continue taking aspirin  on the regular basis. Essential hypertension blood pressure well-controlled continue present management. Dyslipidemia she is taking Lipitor 80 I did review K PN dated from February of last year LDL 90 HDL 63 I will recheck fasting lipid profile   Medication Adjustments/Labs and Tests Ordered: Current medicines are reviewed at length with the patient today.  Concerns regarding medicines are outlined above.  Orders Placed This Encounter  Procedures   EKG 12-Lead   Medication changes: No orders of the defined types were placed in this encounter.   Signed, Lamar DOROTHA Fitch, MD, [facility] 07/13/2024 1:53 PM    [facility]

## 2024-07-13 NOTE — Patient Instructions (Addendum)
 Medication Instructions:   START: Aspirin  81mg  1 tablet daily   Lab Work: 3rd Floor   [address]  Your physician recommends that you return for lab work in:   when fasting You need to have labs done when you are fasting.  You can come Monday through Friday 8:00 am to 11:30AM and 1:00 to 4:00. You do not need to make an appointment as the order has already been placed.   Testing/Procedures: None Ordered   Follow-Up: At [facility], you and your health needs are our priority.  As part of our continuing mission to provide you with exceptional heart care, we have created designated Provider Care Teams.  These Care Teams include your primary Cardiologist (physician) and Advanced Practice Providers (APPs -  Physician Assistants and Nurse Practitioners) who all work together to provide you with the care you need, when you need it.  We recommend signing up for the patient portal called MyChart.  Sign up information is provided on this After Visit Summary.  MyChart is used to connect with patients for Virtual Visits (Telemedicine).  Patients are able to view lab/test results, encounter notes, upcoming appointments, etc.  Non-urgent messages can be sent to your provider as well.   To learn more about what you can do with MyChart, go to [email or web address].    Your next appointment:   6 month(s)  The format for your next appointment:   In Person  Provider:   Lamar Fitch, MD    Other Instructions NA

## 2024-07-15 ENCOUNTER — Ambulatory Visit: Payer: Self-pay | Admitting: Cardiology

## 2024-07-15 DIAGNOSIS — E782 Mixed hyperlipidemia: Secondary | ICD-10-CM

## 2024-07-15 DIAGNOSIS — I2510 Atherosclerotic heart disease of native coronary artery without angina pectoris: Secondary | ICD-10-CM

## 2024-07-15 LAB — LIPID PANEL
Chol/HDL Ratio: 3.6 ratio (ref 0.0–4.4)
Cholesterol, Total: 188 mg/dL (ref 100–199)
HDL: 52 mg/dL (ref >39)
LDL Chol Calc (NIH): 115 mg/dL — ABNORMAL HIGH (ref 0–99)
Triglycerides: 115 mg/dL (ref 0–149)
VLDL Cholesterol Cal: 21 mg/dL (ref 5–40)

## 2024-07-15 LAB — AST: AST: 18 IU/L (ref 0–40)

## 2024-07-15 LAB — ALT: ALT: 16 IU/L (ref 0–32)

## 2024-07-18 ENCOUNTER — Other Ambulatory Visit: Payer: Self-pay | Admitting: Cardiology

## 2024-09-19 DEATH — deceased

## 2024-09-21 MED ORDER — EZETIMIBE 10 MG PO TABS: 10.0000 mg | ORAL_TABLET | Freq: Every day | ORAL | 3 refills | Status: AC

## 2024-09-21 NOTE — Addendum Note (Signed)
 Addended by: ONEITA BERLINER on: 09/21/2024 02:33 PM   Modules accepted: Orders

## 2024-09-21 NOTE — Telephone Encounter (Signed)
-----   Message from Lamar Fitch sent at 07/15/2024 12:10 PM EDT ----- Cholesterol still not good.  Please make sure she takes 80 mg of Lipitor please add Zetia 10 daily, fasting lipid profile, AST ALT 6 weeks ----- Message ----- From: Interface, [facility] Lab Results In Sent: 07/15/2024   5:37 AM EDT To: Lamar JINNY Fitch, MD

## 2024-09-21 NOTE — Telephone Encounter (Signed)
 Letter mailed

## 2024-10-17 ENCOUNTER — Other Ambulatory Visit: Payer: Self-pay | Admitting: Medical
